# Patient Record
Sex: Female | Born: 1935 | Hispanic: Yes | Smoking: Never smoker
Health system: Southern US, Community
[De-identification: ages and names within clinical notes are randomized; demographics above are authoritative.]

## PROBLEM LIST (undated history)

## (undated) DIAGNOSIS — C801 Malignant (primary) neoplasm, unspecified: Secondary | ICD-10-CM

## (undated) DIAGNOSIS — E079 Disorder of thyroid, unspecified: Secondary | ICD-10-CM

## (undated) DIAGNOSIS — K469 Unspecified abdominal hernia without obstruction or gangrene: Secondary | ICD-10-CM

## (undated) DIAGNOSIS — N200 Calculus of kidney: Secondary | ICD-10-CM

## (undated) DIAGNOSIS — I10 Essential (primary) hypertension: Secondary | ICD-10-CM

## (undated) HISTORY — PX: TONSILLECTOMY: SUR1361

## (undated) HISTORY — DX: Disorder of thyroid, unspecified: E07.9

## (undated) HISTORY — DX: Calculus of kidney: N20.0

## (undated) HISTORY — DX: Malignant (primary) neoplasm, unspecified: C80.1

## (undated) HISTORY — DX: Essential (primary) hypertension: I10

## (undated) HISTORY — DX: Unspecified abdominal hernia without obstruction or gangrene: K46.9

---
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
## 1979-11-10 HISTORY — PX: UPPER GI ENDOSCOPY: SHX6162

## 1989-11-09 HISTORY — PX: CHOLECYSTECTOMY: SHX55

## 2005-01-08 ENCOUNTER — Ambulatory Visit: Payer: Self-pay | Admitting: Unknown Physician Specialty

## 2005-09-01 ENCOUNTER — Ambulatory Visit: Payer: Self-pay | Admitting: Unknown Physician Specialty

## 2006-07-14 ENCOUNTER — Ambulatory Visit: Payer: Self-pay | Admitting: Unknown Physician Specialty

## 2006-11-04 ENCOUNTER — Ambulatory Visit: Payer: Self-pay | Admitting: Unknown Physician Specialty

## 2006-12-02 ENCOUNTER — Other Ambulatory Visit: Payer: Self-pay

## 2006-12-02 ENCOUNTER — Emergency Department: Payer: Self-pay | Admitting: Emergency Medicine

## 2008-02-23 ENCOUNTER — Emergency Department: Payer: Self-pay | Admitting: Emergency Medicine

## 2008-02-25 ENCOUNTER — Inpatient Hospital Stay: Payer: Self-pay | Admitting: Urology

## 2008-03-08 ENCOUNTER — Ambulatory Visit: Payer: Self-pay | Admitting: Urology

## 2008-03-09 ENCOUNTER — Ambulatory Visit: Payer: Self-pay | Admitting: Urology

## 2008-03-09 ENCOUNTER — Other Ambulatory Visit: Payer: Self-pay

## 2008-03-13 ENCOUNTER — Ambulatory Visit: Payer: Self-pay | Admitting: Urology

## 2008-05-31 ENCOUNTER — Ambulatory Visit: Payer: Self-pay | Admitting: Unknown Physician Specialty

## 2008-08-13 ENCOUNTER — Ambulatory Visit: Payer: Self-pay | Admitting: Urology

## 2008-11-09 HISTORY — PX: COLONOSCOPY: SHX174

## 2008-11-09 HISTORY — PX: INSERTION CENTRAL VENOUS ACCESS DEVICE W/ SUBCUTANEOUS PORT: SUR725

## 2009-02-17 ENCOUNTER — Emergency Department: Payer: Self-pay | Admitting: Emergency Medicine

## 2009-06-03 ENCOUNTER — Ambulatory Visit: Payer: Self-pay | Admitting: Unknown Physician Specialty

## 2009-07-01 ENCOUNTER — Ambulatory Visit: Payer: Self-pay | Admitting: Urology

## 2009-07-02 ENCOUNTER — Ambulatory Visit: Payer: Self-pay | Admitting: Oncology

## 2009-07-10 ENCOUNTER — Ambulatory Visit: Payer: Self-pay | Admitting: Oncology

## 2009-07-22 ENCOUNTER — Ambulatory Visit: Payer: Self-pay | Admitting: Unknown Physician Specialty

## 2009-07-25 ENCOUNTER — Ambulatory Visit: Payer: Self-pay | Admitting: Oncology

## 2009-07-30 ENCOUNTER — Ambulatory Visit: Payer: Self-pay | Admitting: General Surgery

## 2009-08-09 ENCOUNTER — Ambulatory Visit: Payer: Self-pay | Admitting: Oncology

## 2009-09-09 ENCOUNTER — Ambulatory Visit: Payer: Self-pay | Admitting: Oncology

## 2009-10-01 ENCOUNTER — Ambulatory Visit: Payer: Self-pay | Admitting: Oncology

## 2009-10-09 ENCOUNTER — Ambulatory Visit: Payer: Self-pay | Admitting: Oncology

## 2009-11-09 ENCOUNTER — Ambulatory Visit: Payer: Self-pay | Admitting: Oncology

## 2009-12-10 ENCOUNTER — Ambulatory Visit: Payer: Self-pay | Admitting: Oncology

## 2009-12-12 ENCOUNTER — Ambulatory Visit: Payer: Self-pay | Admitting: Oncology

## 2010-01-07 ENCOUNTER — Ambulatory Visit: Payer: Self-pay | Admitting: Oncology

## 2010-01-07 ENCOUNTER — Ambulatory Visit: Payer: Self-pay | Admitting: Unknown Physician Specialty

## 2010-02-07 ENCOUNTER — Ambulatory Visit: Payer: Self-pay | Admitting: Oncology

## 2010-02-10 ENCOUNTER — Ambulatory Visit: Payer: Self-pay | Admitting: Urology

## 2010-02-11 ENCOUNTER — Ambulatory Visit: Payer: Self-pay | Admitting: Unknown Physician Specialty

## 2010-03-12 ENCOUNTER — Ambulatory Visit: Payer: Self-pay | Admitting: Unknown Physician Specialty

## 2010-05-02 ENCOUNTER — Ambulatory Visit: Payer: Self-pay | Admitting: Unknown Physician Specialty

## 2010-05-02 ENCOUNTER — Ambulatory Visit: Payer: Self-pay | Admitting: Oncology

## 2010-05-06 ENCOUNTER — Ambulatory Visit: Payer: Self-pay | Admitting: Oncology

## 2010-05-09 ENCOUNTER — Ambulatory Visit: Payer: Self-pay | Admitting: Oncology

## 2010-06-06 ENCOUNTER — Ambulatory Visit: Payer: Self-pay | Admitting: Unknown Physician Specialty

## 2010-06-09 ENCOUNTER — Ambulatory Visit: Payer: Self-pay | Admitting: Oncology

## 2010-06-24 ENCOUNTER — Ambulatory Visit: Payer: Self-pay | Admitting: Oncology

## 2010-06-24 ENCOUNTER — Ambulatory Visit: Payer: Self-pay | Admitting: Unknown Physician Specialty

## 2010-07-10 ENCOUNTER — Ambulatory Visit: Payer: Self-pay | Admitting: Oncology

## 2010-08-05 ENCOUNTER — Ambulatory Visit: Payer: Self-pay | Admitting: Unknown Physician Specialty

## 2010-08-09 ENCOUNTER — Ambulatory Visit: Payer: Self-pay | Admitting: Oncology

## 2010-08-27 ENCOUNTER — Ambulatory Visit: Payer: Self-pay | Admitting: Oncology

## 2010-09-09 ENCOUNTER — Ambulatory Visit: Payer: Self-pay | Admitting: Oncology

## 2010-09-19 IMAGING — CT CT ABD-PELV W/ CM
1 of 2 series · 15 of 32 positions shown, 19 images · non-contrast
Comparison: none

REASON FOR EXAM: lymphoma f/u
COMMENTS:

PROCEDURE:     CT  - CT ABDOMEN / PELVIS  W  - May 06, 2010 [DATE]
RESULT:
HISTORY: Lymphoma followup.
COMPARISON STUDY:    Prior CT of 07/01/2009.

[Series 2: abdomen · axial · 0.67mm/px · z∈[-1095,-715]mm · 15 of 84 slices shown, 19 images]
[im 4/84  soft-tissue]
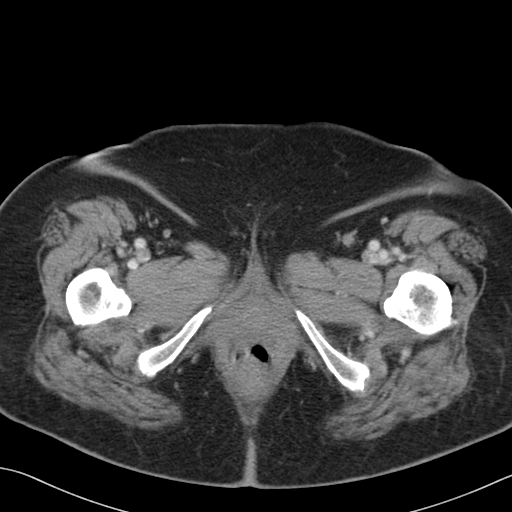
[im 4/84  bone]
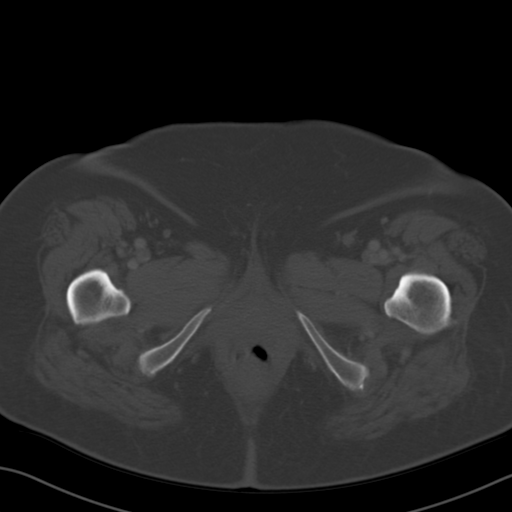
[im 11/84  soft-tissue]
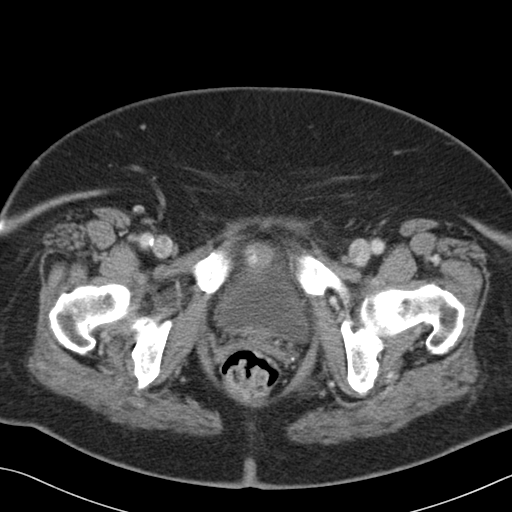
[im 18/84  soft-tissue]
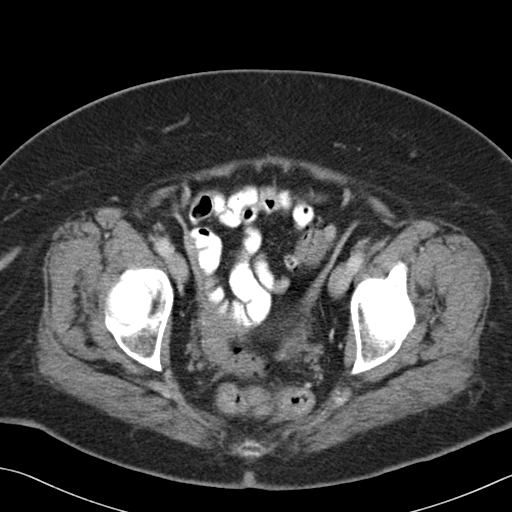
[im 25/84  soft-tissue]
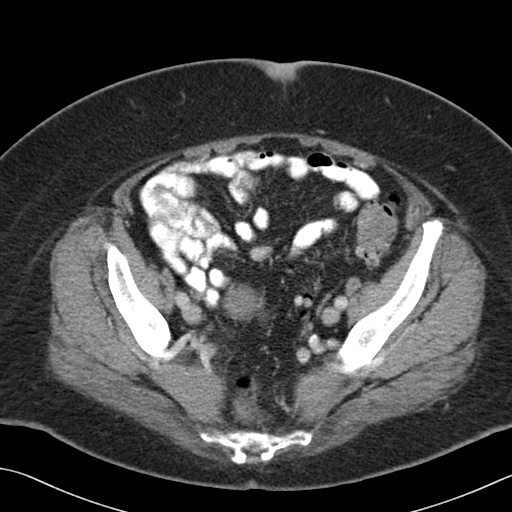
[im 28/84  soft-tissue]
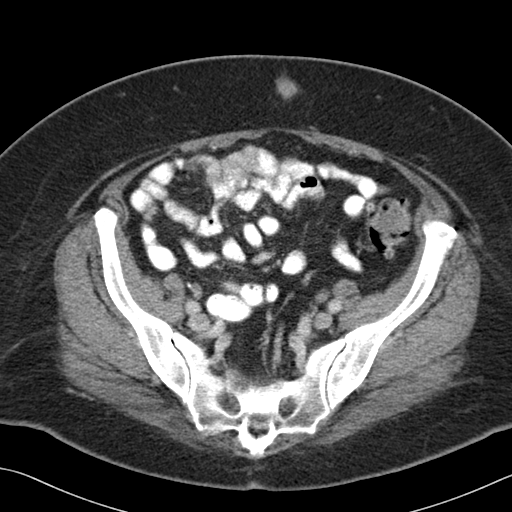
[im 35/84  soft-tissue]
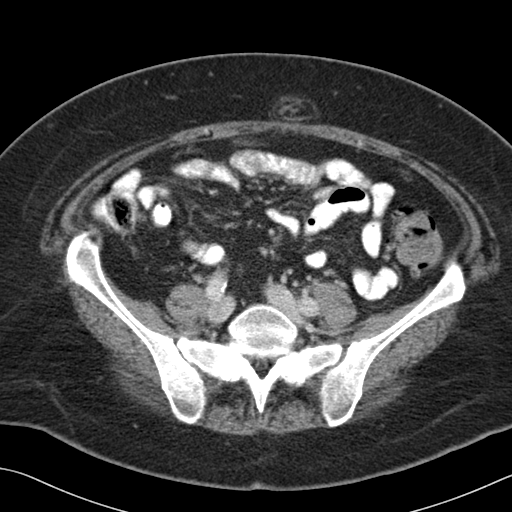
[im 42/84  soft-tissue]
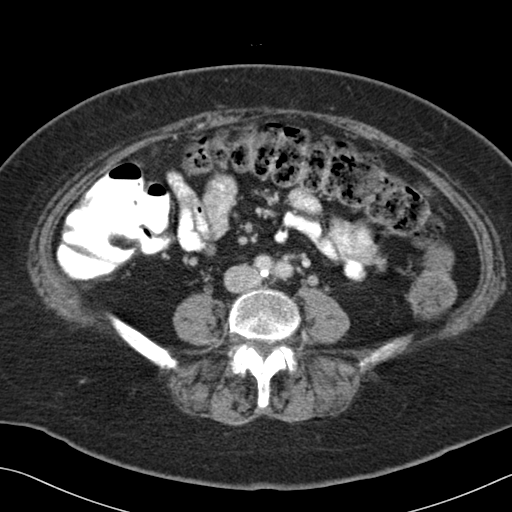
[im 49/84  soft-tissue]
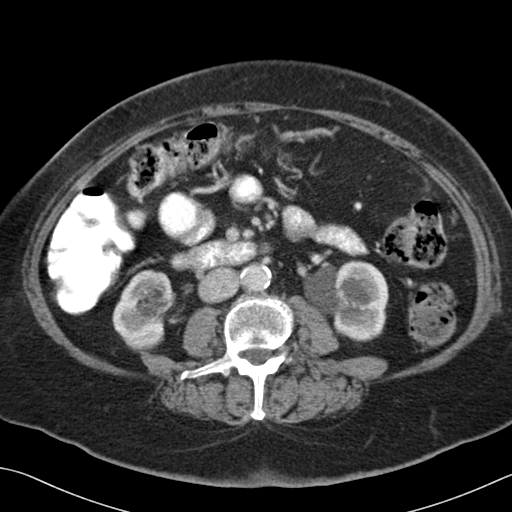
[im 56/84  soft-tissue]
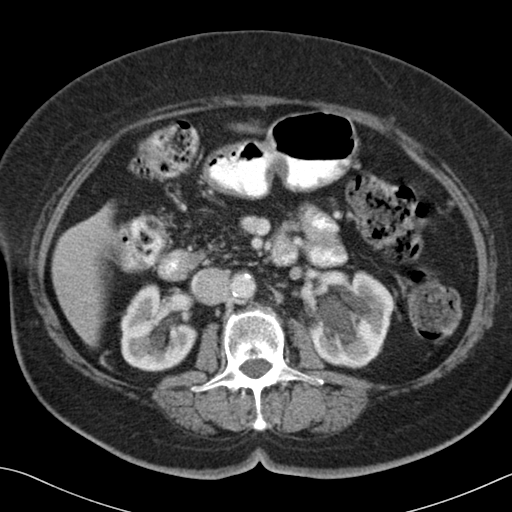
[im 56/84  bone]
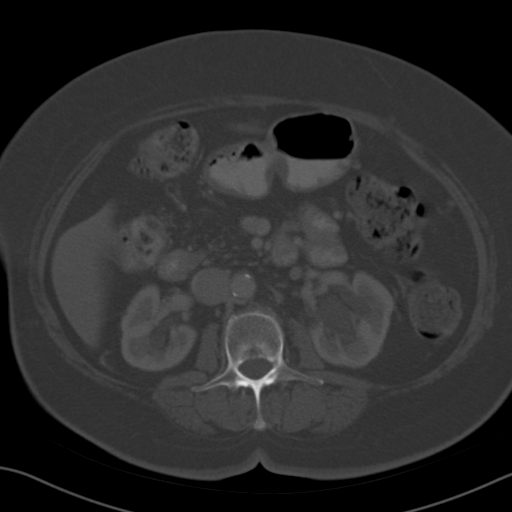
[im 59/84  soft-tissue]
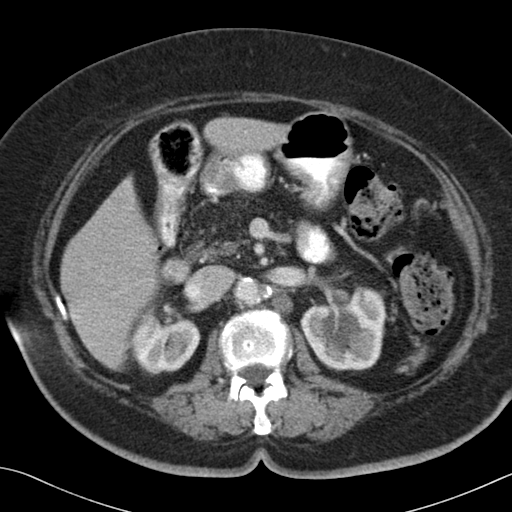
[im 66/84  soft-tissue]
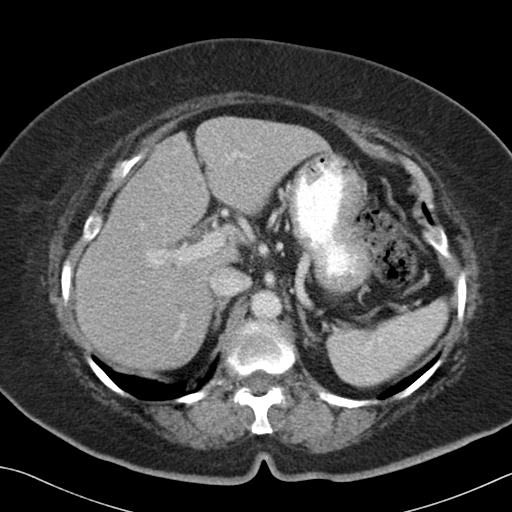
[im 70/84  lung]
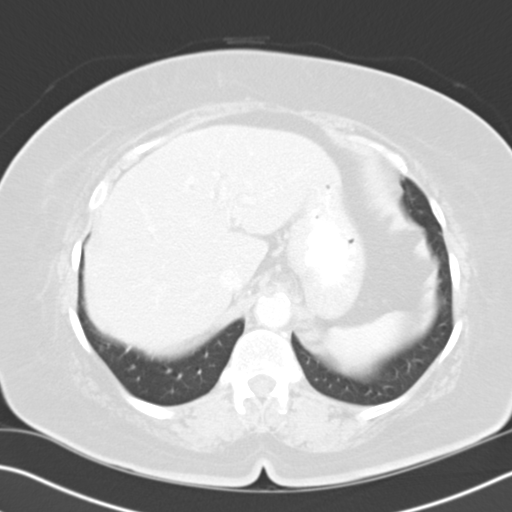
[im 73/84  soft-tissue]
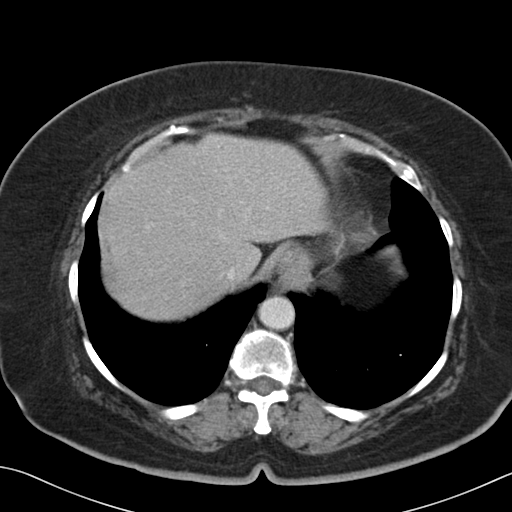
[im 73/84  lung]
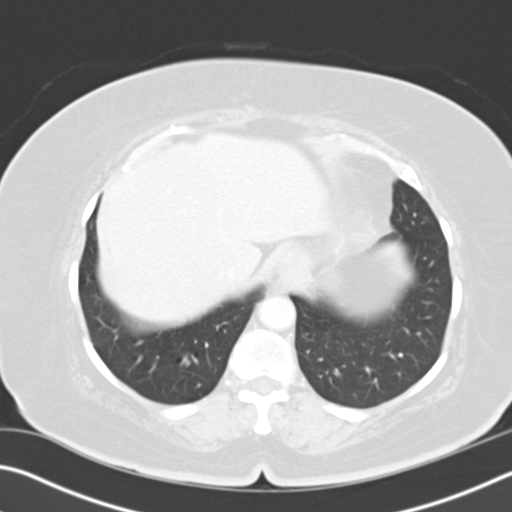
[im 77/84  lung]
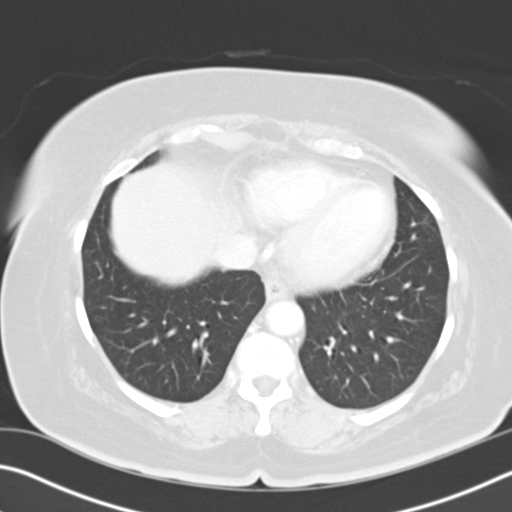
[im 80/84  soft-tissue]
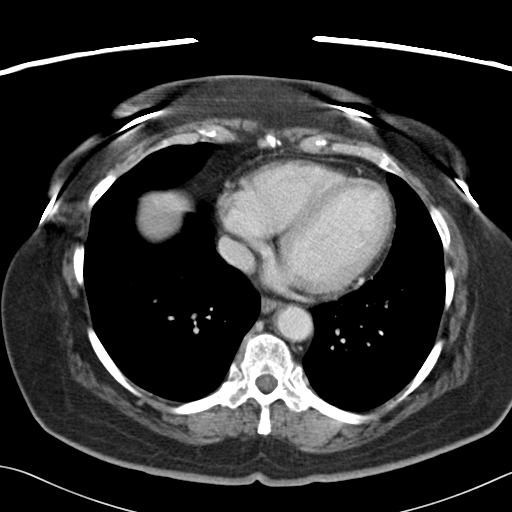
[im 80/84  lung]
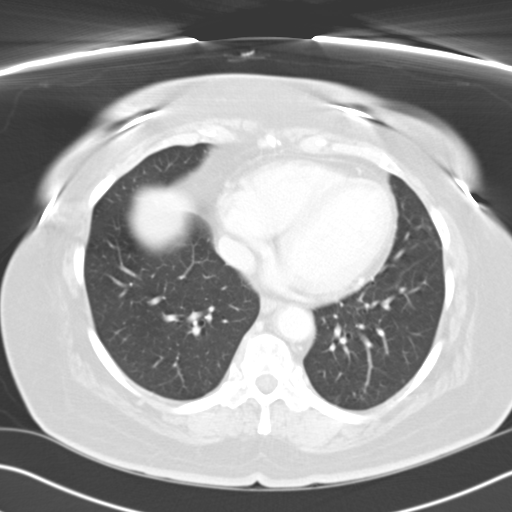

[15 of 32 positions shown; findings below may reference images not displayed]

FINDINGS: Standard IV contrast enhanced CT of the abdomen and pelvis was
obtained with 100 mL of [id number]. Comparison is made to a prior study of
07/01/2009. The liver is normal. The spleen is normal. The pancreas is fatty
replaced. Adrenals are normal. Bilateral parapelvic cysts are again noted. A
mild stable component of hydronephrosis cannot be entirely excluded. There
is no hydroureter. Previously identified mesenteric and retroperitoneal
periaortic adenopathy has improved significantly. The previously identified
large left periaortic lymph node measuring 2.9 cm on scan of 07/01/2009 now
measures only 1.4 cm. There is no bowel distention. Appendix is normal. No
free air is noted. No pathologic pelvic fluid collections are noted. Small
amount of air is present in the bladder. This could be from recent
instrumentation, please correlate with history. Similar finding was noted on
a prior study. No pathologic inguinal adenopathy is noted using CT size
criteria. Small umbilical hernia is present. There is no evidence of bowel
distention.
IMPRESSION: Marked improvement in mesenteric and retroperitoneal adenopathy.

## 2010-10-09 ENCOUNTER — Ambulatory Visit: Payer: Self-pay | Admitting: Oncology

## 2011-01-28 ENCOUNTER — Ambulatory Visit: Payer: Self-pay | Admitting: Oncology

## 2011-02-08 ENCOUNTER — Ambulatory Visit: Payer: Self-pay | Admitting: Oncology

## 2011-02-09 ENCOUNTER — Ambulatory Visit: Payer: Self-pay | Admitting: Urology

## 2011-04-29 ENCOUNTER — Ambulatory Visit: Payer: Self-pay | Admitting: Oncology

## 2011-04-30 ENCOUNTER — Ambulatory Visit: Payer: Self-pay | Admitting: Oncology

## 2011-05-10 ENCOUNTER — Ambulatory Visit: Payer: Self-pay | Admitting: Oncology

## 2011-06-29 ENCOUNTER — Encounter: Payer: Self-pay | Admitting: Unknown Physician Specialty

## 2011-07-07 ENCOUNTER — Ambulatory Visit: Payer: Self-pay | Admitting: Unknown Physician Specialty

## 2011-07-11 ENCOUNTER — Encounter: Payer: Self-pay | Admitting: Unknown Physician Specialty

## 2011-08-10 ENCOUNTER — Encounter: Payer: Self-pay | Admitting: Unknown Physician Specialty

## 2011-08-12 ENCOUNTER — Ambulatory Visit: Payer: Self-pay | Admitting: Oncology

## 2011-09-10 ENCOUNTER — Ambulatory Visit: Payer: Self-pay | Admitting: Oncology

## 2011-12-14 ENCOUNTER — Ambulatory Visit: Payer: Self-pay | Admitting: Oncology

## 2011-12-14 LAB — CREATININE, SERUM
Creatinine: 1.35 mg/dL — ABNORMAL HIGH (ref 0.60–1.30)
EGFR (African American): 49 — ABNORMAL LOW
EGFR (Non-African Amer.): 40 — ABNORMAL LOW

## 2011-12-18 LAB — CBC CANCER CENTER
Basophil #: 0.1 x10 3/mm (ref 0.0–0.1)
Basophil %: 1.3 %
Eosinophil #: 0.3 x10 3/mm (ref 0.0–0.7)
HCT: 37.1 % (ref 35.0–47.0)
HGB: 12.1 g/dL (ref 12.0–16.0)
Lymphocyte #: 1.6 x10 3/mm (ref 1.0–3.6)
Lymphocyte %: 34.6 %
MCH: 31.8 pg (ref 26.0–34.0)
MCHC: 32.5 g/dL (ref 32.0–36.0)
MCV: 97.7 fL (ref 80–100)
Monocyte #: 0.3 x10 3/mm (ref 0.0–0.7)
Monocyte %: 6.1 %
Neutrophil #: 2.5 x10 3/mm (ref 1.4–6.5)
RBC: 3.8 10*6/uL (ref 3.80–5.20)
RDW: 13.5 % (ref 11.5–14.5)
WBC: 4.8 x10 3/mm (ref 3.6–11.0)

## 2011-12-18 LAB — COMPREHENSIVE METABOLIC PANEL
Albumin: 3.8 g/dL (ref 3.4–5.0)
Alkaline Phosphatase: 90 U/L (ref 50–136)
Anion Gap: 7 (ref 7–16)
Bilirubin,Total: 0.4 mg/dL (ref 0.2–1.0)
Co2: 29 mmol/L (ref 21–32)
Creatinine: 1.28 mg/dL (ref 0.60–1.30)
Glucose: 150 mg/dL — ABNORMAL HIGH (ref 65–99)
Osmolality: 288 (ref 275–301)
Potassium: 4 mmol/L (ref 3.5–5.1)
Sodium: 141 mmol/L (ref 136–145)
Total Protein: 6.8 g/dL (ref 6.4–8.2)

## 2012-01-03 ENCOUNTER — Emergency Department: Payer: Self-pay | Admitting: Emergency Medicine

## 2012-01-03 LAB — URINALYSIS, COMPLETE
Bilirubin,UR: NEGATIVE
Blood: NEGATIVE
Ketone: NEGATIVE
Nitrite: NEGATIVE
Ph: 6 (ref 4.5–8.0)
Protein: NEGATIVE
Squamous Epithelial: <1

## 2012-01-03 LAB — PRO B NATRIURETIC PEPTIDE: B-Type Natriuretic Peptide: 270 pg/mL (ref 0–450)

## 2012-01-03 LAB — TROPONIN I: Troponin-I: <0.02 ng/mL

## 2012-01-03 LAB — CK TOTAL AND CKMB (NOT AT ARMC): CK-MB: <0.5 ng/mL — ABNORMAL LOW (ref 0.5–3.6)

## 2012-01-03 LAB — COMPREHENSIVE METABOLIC PANEL
Bilirubin,Total: 0.4 mg/dL (ref 0.2–1.0)
Calcium, Total: 9 mg/dL (ref 8.5–10.1)
Co2: 25 mmol/L (ref 21–32)
EGFR (African American): 52 — ABNORMAL LOW
EGFR (Non-African Amer.): 43 — ABNORMAL LOW
Glucose: 161 mg/dL — ABNORMAL HIGH (ref 65–99)
SGOT(AST): 46 U/L — ABNORMAL HIGH (ref 15–37)
SGPT (ALT): 45 U/L
Total Protein: 7 g/dL (ref 6.4–8.2)

## 2012-01-03 LAB — CBC
HCT: 39.1 % (ref 35.0–47.0)
MCV: 97 fL (ref 80–100)
Platelet: 207 10*3/uL (ref 150–440)
RBC: 4.04 10*6/uL (ref 3.80–5.20)
WBC: 5.3 10*3/uL (ref 3.6–11.0)

## 2012-01-04 LAB — URINE CULTURE

## 2012-01-08 ENCOUNTER — Ambulatory Visit: Payer: Self-pay | Admitting: Oncology

## 2012-06-15 ENCOUNTER — Ambulatory Visit: Payer: Self-pay | Admitting: Orthopedic Surgery

## 2012-06-23 ENCOUNTER — Ambulatory Visit: Payer: Self-pay | Admitting: Orthopedic Surgery

## 2012-06-23 LAB — BASIC METABOLIC PANEL
BUN: 31 mg/dL — ABNORMAL HIGH (ref 7–18)
Calcium, Total: 9.2 mg/dL (ref 8.5–10.1)
Chloride: 105 mmol/L (ref 98–107)
Co2: 30 mmol/L (ref 21–32)
EGFR (Non-African Amer.): 49 — ABNORMAL LOW
Glucose: 105 mg/dL — ABNORMAL HIGH (ref 65–99)
Osmolality: 288 (ref 275–301)
Potassium: 4.2 mmol/L (ref 3.5–5.1)
Sodium: 141 mmol/L (ref 136–145)

## 2012-06-30 ENCOUNTER — Ambulatory Visit: Payer: Self-pay | Admitting: Orthopedic Surgery

## 2012-07-08 ENCOUNTER — Ambulatory Visit: Payer: Self-pay | Admitting: Oncology

## 2012-07-08 LAB — LACTATE DEHYDROGENASE: LDH: 162 U/L (ref 81–234)

## 2012-07-08 LAB — CBC CANCER CENTER
Basophil #: 0.1 x10 3/mm (ref 0.0–0.1)
Eosinophil #: 0.2 x10 3/mm (ref 0.0–0.7)
Eosinophil %: 3.4 %
Lymphocyte #: 1.9 x10 3/mm (ref 1.0–3.6)
Lymphocyte %: 30.3 %
MCHC: 33.5 g/dL (ref 32.0–36.0)
MCV: 97 fL (ref 80–100)
Monocyte %: 7.7 %
Neutrophil #: 3.7 x10 3/mm (ref 1.4–6.5)
Neutrophil %: 57.3 %
Platelet: 201 x10 3/mm (ref 150–440)
RBC: 3.8 10*6/uL (ref 3.80–5.20)
RDW: 14 % (ref 11.5–14.5)
WBC: 6.4 x10 3/mm (ref 3.6–11.0)

## 2012-07-08 LAB — COMPREHENSIVE METABOLIC PANEL
Albumin: 3.6 g/dL (ref 3.4–5.0)
Anion Gap: 7 (ref 7–16)
BUN: 29 mg/dL — ABNORMAL HIGH (ref 7–18)
Calcium, Total: 9.4 mg/dL (ref 8.5–10.1)
Chloride: 105 mmol/L (ref 98–107)
Co2: 28 mmol/L (ref 21–32)
EGFR (African American): 52 — ABNORMAL LOW
Osmolality: 287 (ref 275–301)
Potassium: 4.1 mmol/L (ref 3.5–5.1)
SGPT (ALT): 86 U/L — ABNORMAL HIGH (ref 12–78)
Total Protein: 6.7 g/dL (ref 6.4–8.2)

## 2012-07-08 LAB — SEDIMENTATION RATE: Erythrocyte Sed Rate: 14 mm/hr (ref 0–30)

## 2012-07-10 ENCOUNTER — Ambulatory Visit: Payer: Self-pay | Admitting: Oncology

## 2012-07-12 ENCOUNTER — Ambulatory Visit: Payer: Self-pay | Admitting: Unknown Physician Specialty

## 2012-12-23 DIAGNOSIS — M5430 Sciatica, unspecified side: Secondary | ICD-10-CM | POA: Insufficient documentation

## 2012-12-23 DIAGNOSIS — N200 Calculus of kidney: Secondary | ICD-10-CM | POA: Insufficient documentation

## 2012-12-23 DIAGNOSIS — C8333 Diffuse large B-cell lymphoma, intra-abdominal lymph nodes: Secondary | ICD-10-CM | POA: Insufficient documentation

## 2012-12-23 DIAGNOSIS — N3941 Urge incontinence: Secondary | ICD-10-CM | POA: Insufficient documentation

## 2012-12-23 DIAGNOSIS — R1031 Right lower quadrant pain: Secondary | ICD-10-CM | POA: Insufficient documentation

## 2012-12-29 ENCOUNTER — Ambulatory Visit: Payer: Self-pay | Admitting: Urology

## 2013-01-04 ENCOUNTER — Ambulatory Visit: Payer: Self-pay | Admitting: Oncology

## 2013-01-16 ENCOUNTER — Ambulatory Visit: Payer: Self-pay | Admitting: Oncology

## 2013-02-02 DIAGNOSIS — N3000 Acute cystitis without hematuria: Secondary | ICD-10-CM | POA: Insufficient documentation

## 2013-02-06 ENCOUNTER — Ambulatory Visit: Payer: Self-pay | Admitting: Cardiology

## 2013-02-06 DIAGNOSIS — R931 Abnormal findings on diagnostic imaging of heart and coronary circulation: Secondary | ICD-10-CM | POA: Insufficient documentation

## 2013-02-07 ENCOUNTER — Ambulatory Visit: Payer: Self-pay | Admitting: Oncology

## 2013-03-03 LAB — COMPREHENSIVE METABOLIC PANEL
Albumin: 3.4 g/dL (ref 3.4–5.0)
Anion Gap: 7 (ref 7–16)
BUN: 24 mg/dL — ABNORMAL HIGH (ref 7–18)
Chloride: 106 mmol/L (ref 98–107)
Co2: 30 mmol/L (ref 21–32)
Creatinine: 1.2 mg/dL (ref 0.60–1.30)
EGFR (African American): 50 — ABNORMAL LOW
Potassium: 3.8 mmol/L (ref 3.5–5.1)
SGOT(AST): 44 U/L — ABNORMAL HIGH (ref 15–37)
SGPT (ALT): 48 U/L (ref 12–78)
Sodium: 143 mmol/L (ref 136–145)
Total Protein: 6.5 g/dL (ref 6.4–8.2)

## 2013-03-03 LAB — CBC CANCER CENTER
Basophil #: 0.1 x10 3/mm (ref 0.0–0.1)
Basophil %: 2.7 %
Eosinophil #: 0.4 x10 3/mm (ref 0.0–0.7)
HGB: 12 g/dL (ref 12.0–16.0)
Lymphocyte #: 1.9 x10 3/mm (ref 1.0–3.6)
Lymphocyte %: 35.6 %
MCHC: 33.9 g/dL (ref 32.0–36.0)
MCV: 94 fL (ref 80–100)
Monocyte #: 0.3 x10 3/mm (ref 0.2–0.9)
Monocyte %: 6.2 %
Neutrophil #: 2.6 x10 3/mm (ref 1.4–6.5)
Neutrophil %: 48.6 %
Platelet: 211 x10 3/mm (ref 150–440)
RDW: 14.1 % (ref 11.5–14.5)
WBC: 5.3 x10 3/mm (ref 3.6–11.0)

## 2013-03-03 LAB — SEDIMENTATION RATE: Erythrocyte Sed Rate: 21 mm/hr (ref 0–30)

## 2013-03-03 LAB — LACTATE DEHYDROGENASE: LDH: 202 U/L (ref 81–246)

## 2013-03-09 ENCOUNTER — Ambulatory Visit: Payer: Self-pay | Admitting: Oncology

## 2013-05-12 ENCOUNTER — Emergency Department: Payer: Self-pay | Admitting: Emergency Medicine

## 2013-07-07 ENCOUNTER — Ambulatory Visit: Payer: Self-pay | Admitting: Family Medicine

## 2013-07-17 ENCOUNTER — Ambulatory Visit: Payer: Self-pay | Admitting: Physician Assistant

## 2013-08-01 ENCOUNTER — Ambulatory Visit: Payer: Self-pay | Admitting: Internal Medicine

## 2013-08-09 ENCOUNTER — Ambulatory Visit: Payer: Self-pay | Admitting: Oncology

## 2013-10-13 ENCOUNTER — Ambulatory Visit: Payer: Self-pay | Admitting: Physician Assistant

## 2013-10-24 ENCOUNTER — Encounter: Payer: Self-pay | Admitting: General Surgery

## 2013-10-24 ENCOUNTER — Ambulatory Visit (INDEPENDENT_AMBULATORY_CARE_PROVIDER_SITE_OTHER): Payer: Medicare Other | Admitting: General Surgery

## 2013-10-24 VITALS — BP 130/76 | Ht 61.0 in | Wt 193.0 lb

## 2013-10-24 DIAGNOSIS — Z8572 Personal history of non-Hodgkin lymphomas: Secondary | ICD-10-CM | POA: Insufficient documentation

## 2013-10-24 DIAGNOSIS — Z87898 Personal history of other specified conditions: Secondary | ICD-10-CM

## 2013-10-24 NOTE — Progress Notes (Signed)
She is here today for port removal. Patient had Non Hodgkin's Lymphoma and is now in remission.  Her last chemotherapy was January  2011.   Port removed today.  Anesthetic: 1% xylocaine mixed with 0.5%marcaine 10ml  After prep with chloraprep, there prior incision was opened. Capsule around port opened, anchoring sutures removed and port /catheter removed in entirety.  No bleeding noted. Wound closed with 3-0 vicryl in subcutaneous tissue and skin closed with 4-0 vicry. Dressed with steri strips, telfa and tegaderm.  Procedure well tolerated.

## 2013-10-24 NOTE — Progress Notes (Deleted)
Patient presents for a port removal at today's visit.

## 2013-10-24 NOTE — Patient Instructions (Addendum)
Keep area clean and dry Remove dressing in 2-3 days Leave steri strip in place for one week.

## 2013-11-23 ENCOUNTER — Emergency Department: Payer: Self-pay | Admitting: Emergency Medicine

## 2013-11-25 LAB — BETA STREP CULTURE(ARMC)

## 2014-01-24 ENCOUNTER — Ambulatory Visit: Payer: Self-pay | Admitting: Oncology

## 2014-01-26 LAB — CBC CANCER CENTER
Basophil #: 0.1 x10 3/mm (ref 0.0–0.1)
Basophil %: 2.3 %
Eosinophil #: 0.3 x10 3/mm (ref 0.0–0.7)
Eosinophil %: 4.3 %
HCT: 39.3 % (ref 35.0–47.0)
HGB: 13 g/dL (ref 12.0–16.0)
LYMPHS PCT: 36.2 %
Lymphocyte #: 2.2 x10 3/mm (ref 1.0–3.6)
MCH: 31.5 pg (ref 26.0–34.0)
MCHC: 33 g/dL (ref 32.0–36.0)
MCV: 95 fL (ref 80–100)
MONO ABS: 0.4 x10 3/mm (ref 0.2–0.9)
Monocyte %: 6.4 %
Neutrophil #: 3.1 x10 3/mm (ref 1.4–6.5)
Neutrophil %: 50.8 %
Platelet: 217 x10 3/mm (ref 150–440)
RBC: 4.12 10*6/uL (ref 3.80–5.20)
RDW: 13.8 % (ref 11.5–14.5)
WBC: 6.2 x10 3/mm (ref 3.6–11.0)

## 2014-01-26 LAB — COMPREHENSIVE METABOLIC PANEL
ANION GAP: 6 — AB (ref 7–16)
Albumin: 3.6 g/dL (ref 3.4–5.0)
Alkaline Phosphatase: 81 U/L
BILIRUBIN TOTAL: 0.3 mg/dL (ref 0.2–1.0)
BUN: 27 mg/dL — ABNORMAL HIGH (ref 7–18)
CO2: 30 mmol/L (ref 21–32)
Calcium, Total: 8.9 mg/dL (ref 8.5–10.1)
Chloride: 106 mmol/L (ref 98–107)
Creatinine: 1.23 mg/dL (ref 0.60–1.30)
GFR CALC AF AMER: 48 — AB
GFR CALC NON AF AMER: 42 — AB
GLUCOSE: 105 mg/dL — AB (ref 65–99)
Osmolality: 289 (ref 275–301)
Potassium: 4.2 mmol/L (ref 3.5–5.1)
SGOT(AST): 37 U/L (ref 15–37)
SGPT (ALT): 46 U/L (ref 12–78)
Sodium: 142 mmol/L (ref 136–145)
Total Protein: 6.8 g/dL (ref 6.4–8.2)

## 2014-01-26 LAB — LACTATE DEHYDROGENASE: LDH: 154 U/L (ref 81–246)

## 2014-01-26 LAB — SEDIMENTATION RATE: Erythrocyte Sed Rate: 13 mm/hr (ref 0–30)

## 2014-02-07 ENCOUNTER — Ambulatory Visit: Payer: Self-pay | Admitting: Oncology

## 2014-05-19 ENCOUNTER — Emergency Department: Payer: Self-pay | Admitting: Emergency Medicine

## 2014-05-23 DIAGNOSIS — I10 Essential (primary) hypertension: Secondary | ICD-10-CM | POA: Insufficient documentation

## 2014-07-18 ENCOUNTER — Ambulatory Visit: Payer: Self-pay | Admitting: Physician Assistant

## 2014-07-23 ENCOUNTER — Ambulatory Visit: Payer: Self-pay | Admitting: Physician Assistant

## 2014-07-27 ENCOUNTER — Ambulatory Visit: Payer: Self-pay | Admitting: Oncology

## 2014-07-27 LAB — COMPREHENSIVE METABOLIC PANEL
ALK PHOS: 78 U/L
Albumin: 3.6 g/dL (ref 3.4–5.0)
Anion Gap: 7 (ref 7–16)
BUN: 22 mg/dL — ABNORMAL HIGH (ref 7–18)
Bilirubin,Total: 0.6 mg/dL (ref 0.2–1.0)
CALCIUM: 9 mg/dL (ref 8.5–10.1)
CO2: 30 mmol/L (ref 21–32)
Chloride: 104 mmol/L (ref 98–107)
Creatinine: 1.4 mg/dL — ABNORMAL HIGH (ref 0.60–1.30)
EGFR (Non-African Amer.): 36 — ABNORMAL LOW
GFR CALC AF AMER: 41 — AB
GLUCOSE: 136 mg/dL — AB (ref 65–99)
Osmolality: 287 (ref 275–301)
POTASSIUM: 3.7 mmol/L (ref 3.5–5.1)
SGOT(AST): 35 U/L (ref 15–37)
SGPT (ALT): 30 U/L
Sodium: 141 mmol/L (ref 136–145)
Total Protein: 6.9 g/dL (ref 6.4–8.2)

## 2014-07-27 LAB — CBC CANCER CENTER
BASOS PCT: 2.5 %
Basophil #: 0.2 x10 3/mm — ABNORMAL HIGH (ref 0.0–0.1)
EOS PCT: 3.9 %
Eosinophil #: 0.2 x10 3/mm (ref 0.0–0.7)
HCT: 39.8 % (ref 35.0–47.0)
HGB: 13.2 g/dL (ref 12.0–16.0)
LYMPHS PCT: 37 %
Lymphocyte #: 2.2 x10 3/mm (ref 1.0–3.6)
MCH: 31.8 pg (ref 26.0–34.0)
MCHC: 33.2 g/dL (ref 32.0–36.0)
MCV: 96 fL (ref 80–100)
MONOS PCT: 7.5 %
Monocyte #: 0.5 x10 3/mm (ref 0.2–0.9)
Neutrophil #: 3 x10 3/mm (ref 1.4–6.5)
Neutrophil %: 49.1 %
PLATELETS: 220 x10 3/mm (ref 150–440)
RBC: 4.15 10*6/uL (ref 3.80–5.20)
RDW: 14.3 % (ref 11.5–14.5)
WBC: 6.1 x10 3/mm (ref 3.6–11.0)

## 2014-07-27 LAB — LACTATE DEHYDROGENASE: LDH: 172 U/L (ref 81–246)

## 2014-08-06 ENCOUNTER — Ambulatory Visit: Payer: Self-pay | Admitting: Unknown Physician Specialty

## 2014-08-08 LAB — PATHOLOGY REPORT

## 2014-08-09 ENCOUNTER — Ambulatory Visit: Payer: Self-pay | Admitting: Oncology

## 2014-08-23 DIAGNOSIS — I2510 Atherosclerotic heart disease of native coronary artery without angina pectoris: Secondary | ICD-10-CM | POA: Insufficient documentation

## 2014-11-07 DIAGNOSIS — J069 Acute upper respiratory infection, unspecified: Secondary | ICD-10-CM | POA: Insufficient documentation

## 2015-01-21 ENCOUNTER — Ambulatory Visit
Admit: 2015-01-21 | Disposition: A | Discharge status: Home / Self Care | Payer: Self-pay | Attending: Oncology | Admitting: Oncology

## 2015-02-08 ENCOUNTER — Ambulatory Visit
Admit: 2015-02-08 | Disposition: A | Discharge status: Home / Self Care | Payer: Self-pay | Attending: Oncology | Admitting: Oncology

## 2015-02-26 NOTE — Op Note (Signed)
PATIENT NAME:  Rebecca, Stokes MR#:  [id number] DATE OF BIRTH:  06-08-1932  DATE OF PROCEDURE:  06/30/2012  PREOPERATIVE DIAGNOSIS: Right gamekeeper thumb.   POSTOPERATIVE DIAGNOSES: Right gamekeeper thumb.   PROCEDURE:   Repair of gamekeeper thumb ulnar collateral ligament MCP joint.   SURGEON: Laurene Footman, MD.   ANESTHESIA: General.   DESCRIPTION OF PROCEDURE: The patient was brought to the Operating Room and after adequate anesthesia was obtained, the right arm was prepped and draped in the usual sterile fashion with a tourniquet applied to the upper arm. After appropriate patient identification and time-out procedures were carried out, an incision was made at the dorsal ulnar aspect of the MCP joint approximately 3 cm in length. The subcutaneous tissue was spread preserving the dorsal nerve. The adductor aponeurosis was identified and incised. Underneath this the ulnar collateral ligament was found to be completely ruptured. It had come off close to its attachment on the metacarpal head. The ends of the ligament were mobilized and then repaired using a 3-0 Ethibond suture. After repairing the ligament, the thumb was stable in both flexion and extension at the MCP joint to ulnar deviation. The adductor tendon was then repaired using a 3-0 Vicryl. The wound was irrigated and closed with 4-0 nylon. 5 mL of 0.5% Sensorcaine without epinephrine were infiltrated for postoperative analgesia. Xeroform, 4 x 4's, Webril and a radial gutter splint were applied. The tourniquet was let down at the close of the case. Tourniquet time was 20 minutes at 250 mmHg. There were no specimens and no complications.   ____________________________ Laurene Footman, MD mjm:ap D: 06/30/2012 20:57:50 ET T: 07/01/2012 08:54:39 ET JOB#: [id number]  cc: Laurene Footman, MD, <Dictator>  Laurene Footman MD ELECTRONICALLY SIGNED 07/01/2012 9:59

## 2015-06-11 ENCOUNTER — Emergency Department
Admission: EM | Admit: 2015-06-11 | Discharge: 2015-06-11 | Discharge status: Against Medical Advice | Payer: Medicare Other | Attending: Emergency Medicine | Admitting: Emergency Medicine

## 2015-06-11 DIAGNOSIS — Z87891 Personal history of nicotine dependence: Secondary | ICD-10-CM | POA: Diagnosis not present

## 2015-06-11 DIAGNOSIS — I10 Essential (primary) hypertension: Secondary | ICD-10-CM | POA: Diagnosis not present

## 2015-06-11 DIAGNOSIS — K922 Gastrointestinal hemorrhage, unspecified: Secondary | ICD-10-CM | POA: Diagnosis present

## 2015-06-11 LAB — CBC
HEMATOCRIT: 39.9 % (ref 35.0–47.0)
Hemoglobin: 13.3 g/dL (ref 12.0–16.0)
MCH: 31 pg (ref 26.0–34.0)
MCHC: 33.3 g/dL (ref 32.0–36.0)
MCV: 93.2 fL (ref 80.0–100.0)
PLATELETS: 205 10*3/uL (ref 150–440)
RBC: 4.28 MIL/uL (ref 3.80–5.20)
RDW: 14.2 % (ref 11.5–14.5)
WBC: 6.1 10*3/uL (ref 3.6–11.0)

## 2015-06-11 LAB — COMPREHENSIVE METABOLIC PANEL
ALT: 16 U/L (ref 14–54)
ANION GAP: 7 (ref 5–15)
AST: 32 U/L (ref 15–41)
Albumin: 4.1 g/dL (ref 3.5–5.0)
Alkaline Phosphatase: 60 U/L (ref 38–126)
BUN: 24 mg/dL — ABNORMAL HIGH (ref 6–20)
CO2: 28 mmol/L (ref 22–32)
Calcium: 9.3 mg/dL (ref 8.9–10.3)
Chloride: 106 mmol/L (ref 101–111)
Creatinine, Ser: 1.09 mg/dL — ABNORMAL HIGH (ref 0.44–1.00)
GFR calc Af Amer: 54 mL/min — ABNORMAL LOW (ref >60)
GFR calc non Af Amer: 47 mL/min — ABNORMAL LOW (ref >60)
Glucose, Bld: 112 mg/dL — ABNORMAL HIGH (ref 65–99)
POTASSIUM: 4.3 mmol/L (ref 3.5–5.1)
SODIUM: 141 mmol/L (ref 135–145)
Total Bilirubin: 0.7 mg/dL (ref 0.3–1.2)
Total Protein: 7.1 g/dL (ref 6.5–8.1)

## 2015-06-11 LAB — TYPE AND SCREEN
ABO/RH(D): O POS
Antibody Screen: NEGATIVE

## 2015-06-11 LAB — ABO/RH: ABO/RH(D): O POS

## 2015-06-11 NOTE — ED Notes (Signed)
Pt states she is going to make an appointment tomorrow with her PCP because she doesn't want to wait to be seen.

## 2015-06-11 NOTE — ED Notes (Signed)
Pt c/o passing a large hard stool yesterday and passed bright red blood with it, states today has bloody diarrhea x1.Marland Kitchen

## 2015-07-19 ENCOUNTER — Other Ambulatory Visit: Payer: Self-pay | Admitting: *Deleted

## 2015-07-19 DIAGNOSIS — Z8572 Personal history of non-Hodgkin lymphomas: Secondary | ICD-10-CM

## 2015-07-25 ENCOUNTER — Other Ambulatory Visit: Payer: Self-pay

## 2015-07-25 ENCOUNTER — Ambulatory Visit: Payer: Self-pay | Admitting: Oncology

## 2015-07-31 ENCOUNTER — Ambulatory Visit: Payer: Medicare Other

## 2015-07-31 ENCOUNTER — Inpatient Hospital Stay: Payer: Medicare Other | Attending: Oncology | Admitting: Oncology

## 2015-07-31 ENCOUNTER — Inpatient Hospital Stay: Payer: Medicare Other

## 2015-07-31 ENCOUNTER — Encounter: Payer: Self-pay | Admitting: Oncology

## 2015-07-31 VITALS — BP 151/68 | HR 66 | Temp 94.8°F | Wt 175.2 lb

## 2015-07-31 DIAGNOSIS — E079 Disorder of thyroid, unspecified: Secondary | ICD-10-CM | POA: Diagnosis not present

## 2015-07-31 DIAGNOSIS — Z23 Encounter for immunization: Secondary | ICD-10-CM | POA: Insufficient documentation

## 2015-07-31 DIAGNOSIS — Z7982 Long term (current) use of aspirin: Secondary | ICD-10-CM | POA: Insufficient documentation

## 2015-07-31 DIAGNOSIS — K635 Polyp of colon: Secondary | ICD-10-CM | POA: Insufficient documentation

## 2015-07-31 DIAGNOSIS — E7800 Pure hypercholesterolemia, unspecified: Secondary | ICD-10-CM | POA: Insufficient documentation

## 2015-07-31 DIAGNOSIS — Z8572 Personal history of non-Hodgkin lymphomas: Secondary | ICD-10-CM

## 2015-07-31 DIAGNOSIS — Z79899 Other long term (current) drug therapy: Secondary | ICD-10-CM | POA: Diagnosis not present

## 2015-07-31 DIAGNOSIS — Z87891 Personal history of nicotine dependence: Secondary | ICD-10-CM | POA: Diagnosis not present

## 2015-07-31 DIAGNOSIS — R51 Headache: Secondary | ICD-10-CM

## 2015-07-31 DIAGNOSIS — R519 Headache, unspecified: Secondary | ICD-10-CM | POA: Insufficient documentation

## 2015-07-31 DIAGNOSIS — I10 Essential (primary) hypertension: Secondary | ICD-10-CM | POA: Diagnosis not present

## 2015-07-31 DIAGNOSIS — F419 Anxiety disorder, unspecified: Secondary | ICD-10-CM | POA: Insufficient documentation

## 2015-07-31 DIAGNOSIS — Z87442 Personal history of urinary calculi: Secondary | ICD-10-CM | POA: Diagnosis not present

## 2015-07-31 DIAGNOSIS — Z9221 Personal history of antineoplastic chemotherapy: Secondary | ICD-10-CM

## 2015-07-31 DIAGNOSIS — IMO0002 Reserved for concepts with insufficient information to code with codable children: Secondary | ICD-10-CM | POA: Insufficient documentation

## 2015-07-31 DIAGNOSIS — Z87898 Personal history of other specified conditions: Secondary | ICD-10-CM | POA: Insufficient documentation

## 2015-07-31 DIAGNOSIS — E039 Hypothyroidism, unspecified: Secondary | ICD-10-CM | POA: Diagnosis not present

## 2015-07-31 DIAGNOSIS — C8330 Diffuse large B-cell lymphoma, unspecified site: Secondary | ICD-10-CM | POA: Insufficient documentation

## 2015-07-31 DIAGNOSIS — K5790 Diverticulosis of intestine, part unspecified, without perforation or abscess without bleeding: Secondary | ICD-10-CM | POA: Insufficient documentation

## 2015-07-31 DIAGNOSIS — C8590 Non-Hodgkin lymphoma, unspecified, unspecified site: Secondary | ICD-10-CM

## 2015-07-31 DIAGNOSIS — K5732 Diverticulitis of large intestine without perforation or abscess without bleeding: Secondary | ICD-10-CM | POA: Insufficient documentation

## 2015-07-31 LAB — COMPREHENSIVE METABOLIC PANEL
ALBUMIN: 4.2 g/dL (ref 3.5–5.0)
ALT: 17 U/L (ref 14–54)
AST: 29 U/L (ref 15–41)
Alkaline Phosphatase: 59 U/L (ref 38–126)
Anion gap: 8 (ref 5–15)
BUN: 34 mg/dL — AB (ref 6–20)
CO2: 27 mmol/L (ref 22–32)
Calcium: 9.6 mg/dL (ref 8.9–10.3)
Chloride: 104 mmol/L (ref 101–111)
Creatinine, Ser: 1.2 mg/dL — ABNORMAL HIGH (ref 0.44–1.00)
GFR calc Af Amer: 48 mL/min — ABNORMAL LOW (ref >60)
GFR calc non Af Amer: 42 mL/min — ABNORMAL LOW (ref >60)
Glucose, Bld: 101 mg/dL — ABNORMAL HIGH (ref 65–99)
POTASSIUM: 4.7 mmol/L (ref 3.5–5.1)
Sodium: 139 mmol/L (ref 135–145)
Total Bilirubin: 0.9 mg/dL (ref 0.3–1.2)
Total Protein: 7 g/dL (ref 6.5–8.1)

## 2015-07-31 LAB — LACTATE DEHYDROGENASE: LDH: 131 U/L (ref 98–192)

## 2015-07-31 LAB — CBC WITH DIFFERENTIAL/PLATELET
BASOS ABS: 0.1 10*3/uL (ref 0–0.1)
BASOS PCT: 1 %
Eosinophils Absolute: 0.1 10*3/uL (ref 0–0.7)
Eosinophils Relative: 1 %
HCT: 39.7 % (ref 35.0–47.0)
Hemoglobin: 13.1 g/dL (ref 12.0–16.0)
LYMPHS PCT: 36 %
Lymphs Abs: 3.5 10*3/uL (ref 1.0–3.6)
MCH: 30.8 pg (ref 26.0–34.0)
MCHC: 33 g/dL (ref 32.0–36.0)
MCV: 93.4 fL (ref 80.0–100.0)
MONO ABS: 0.6 10*3/uL (ref 0.2–0.9)
Monocytes Relative: 7 %
Neutro Abs: 5.4 10*3/uL (ref 1.4–6.5)
Neutrophils Relative %: 55 %
PLATELETS: 237 10*3/uL (ref 150–440)
RBC: 4.25 MIL/uL (ref 3.80–5.20)
RDW: 13.8 % (ref 11.5–14.5)
WBC: 9.8 10*3/uL (ref 3.6–11.0)

## 2015-07-31 MED ORDER — INFLUENZA VAC SPLIT QUAD 0.5 ML IM SUSY
0.5000 mL | PREFILLED_SYRINGE | Freq: Once | INTRAMUSCULAR | Status: AC
Start: 2015-07-31 — End: 2015-07-31
  Administered 2015-07-31: 0.5 mL via INTRAMUSCULAR

## 2015-07-31 NOTE — Progress Notes (Signed)
Patient does have living will. Former smoker.  Patient has had bronchitis for past few weeks.

## 2015-07-31 NOTE — Progress Notes (Signed)
[place] @ [facility] [phone]  Fax:([phone]) [place]: 08/26/1932  MR#: [id number]  ZYY#:[id number]  Patient Care Team: Marinda Elk, MD as PCP - General (Physician Assistant) Christene Lye, MD (General Surgery) Forest Gleason, MD (Unknown Physician Specialty)  CHIEF COMPLAINT:  Chief Complaint  Patient presents with  . OTHER    Chief Complaint/Diagnosis:   1. Diffuse large cell lymphoma stage III, status post 7 cycles of chemotherapy with R. CHOP.  Diagnosis was in 2010 INTERVAL HISTORY:   81 year old lady with a history of f diffuse large cell lymphoma H3 disease.  Status post chemotherapy Came today further follow-up.  Most the side effect of chemotherapy is now resolving.  No numbness.  Does not have any abdominal pain no chills no fever. Here for further follow-up and treatment consideration 2 weeks ago patient had a symptoms of cough.  Patient went to acute care received amoxicillin and prednisone. No x-rays were done patient reports improvement  REVIEW OF SYSTEMS:   GENERAL:  Feels good.  Active.  No fevers, sweats or weight loss. PERFORMANCE STATUS (ECOG)01 HEENT:  No visual changes, runny nose, sore throat, mouth sores or tenderness. Lungs: No shortness of breath or cough.  No hemoptysis. Cardiac:  No chest pain, palpitations, orthopnea, or PND. GI:  No nausea, vomiting, diarrhea, constipation, melena or hematochezia. GU:  No urgency, frequency, dysuria, or hematuria. Musculoskeletal:  No back pain.  No joint pain.  No muscle tenderness. Extremities:  No pain or swelling. Skin:  No rashes or skin changes. Neuro:  No headache, numbness or weakness, balance or coordination issues. Endocrine:  No diabetes, thyroid issues, hot flashes or night sweats. Psych:  No mood changes, depression or anxiety. Pain:  No focal pain. Review of systems:  All other systems reviewed and found to be negative. As per HPI. Otherwise, a  complete review of systems is negatve.  PAST MEDICAL HISTORY: Past Medical History  Diagnosis Date  . Kidney stone   . Thyroid disease   . Hypertension   . Hernia   . Cancer     non hodgkins lymphoma    PAST SURGICAL HISTORY: Past Surgical History  Procedure Laterality Date  . Abdominal hysterectomy  1938  . Insertion central venous access device w/ subcutaneous port  2010    Dr Jamal Collin  . Cholecystectomy  1991  . Upper gi endoscopy  1981  . Colonoscopy  2010  . Tonsillectomy      Allergies:  Codeine: N/V  Sulfa drugs: Itching, Rash  Levaquin: Insomnia  Seafood: Swelling  Phenergan: Unknown  Strawberry: Unknown  Fish: Angioedema  Tea: Hives  Cipro: Other  Significant History/PMH:   HTN:    non hodgkins lymphoma:    Kidney Stones:    Hypothyroidism:    Gall Bladder:    Hysterectomy - Total:   Smoking History: Smoking History Never Smoked.  PFSH: Family History: No family history of colorectal cancer, breast cancer, or ovarian cancer.  Comments: mother had high cholesterol  Social History: does not smoke.  Does not drink  Additional Past Medical and Surgical History: history of bladder surgery   Hysterectomy   Cholecystectomy   Hypertension   Hypothyroidism   ADVANCED DIRECTIVES:  Patient does have advance healthcare directive, Patient   does not desire to make any changes  HEALTH MAINTENANCE: Social History  Substance Use Topics  . Smoking status: Former Research scientist (life sciences)  . Smokeless tobacco: None  . Alcohol Use: No  Allergies  Allergen Reactions  . Fish Allergy Anaphylaxis  . Codeine Itching and Nausea Only  . Ephedrine Itching and Nausea Only  . Phenergan [Promethazine Hcl] Itching and Nausea Only  . Sulphur [Sulfur] Itching and Nausea Only  . Other Rash    Blistering rash from sun exposure  . Strawberry Rash    Current Outpatient Prescriptions  Medication Sig Dispense Refill  . aspirin EC 81 MG tablet Take 81 mg by mouth.    .  BENICAR 20 MG tablet Take 20 mg by mouth daily.     . Biotin 10 MG TABS Take by mouth daily.    Marland Kitchen co-enzyme Q-10 30 MG capsule Take 30 mg by mouth daily.    . Cyanocobalamin (VITAMIN B 12 PO) Take by mouth daily.    . hydrochlorothiazide (HYDRODIURIL) 25 MG tablet Take 25 mg by mouth daily.     Marland Kitchen levothyroxine (SYNTHROID, LEVOTHROID) 100 MCG tablet Take 100 mcg by mouth daily before breakfast.     . Multiple Vitamins-Minerals (WOMENS 50+ MULTI VITAMIN/MIN PO) Take by mouth daily.    Marland Kitchen omega-3 acid ethyl esters (LOVAZA) 1 G capsule Take 1 g by mouth daily.    Marland Kitchen triamcinolone cream (KENALOG) 0.1 % Apply 1 application topically as needed.     Marland Kitchen omeprazole (PRILOSEC) 20 MG capsule Take 20 mg by mouth daily.      No current facility-administered medications for this visit.    OBJECTIVE:  Filed Vitals:   07/31/15 1120  BP: 151/68  Pulse: 66  Temp: 94.8 F (34.9 C)     Body mass index is 33.13 kg/(m^2).    ECOG FS:1 - Symptomatic but completely ambulatory  PHYSICAL EXAM: GENERAL:  Well developed, well nourished, sitting comfortably in the exam room in no acute distress. MENTAL STATUS:  Alert and oriented to person, place and time.  RESPIRATORY:  Clear to auscultation without rales, wheezes or rhonchi. CARDIOVASCULAR:  Regular rate and rhythm without murmur, rub or gallop.  ABDOMEN:  Soft, non-tender, with active bowel sounds, and no hepatosplenomegaly.  No masses. BACK:  No CVA tenderness.  No tenderness on percussion of the back or rib cage. SKIN:  No rashes, ulcers or lesions. EXTREMITIES: No edema, no skin discoloration or tenderness.  No palpable cords. LYMPH NODES: No palpable cervical, supraclavicular, axillary or inguinal adenopathy  NEUROLOGICAL: Unremarkable. PSYCH:  Appropriate.   LAB RESULTS:  CBC Latest Ref Rng 07/31/2015 06/11/2015  WBC 3.6 - 11.0 K/uL 9.8 6.1  Hemoglobin 12.0 - 16.0 g/dL 13.1 13.3  Hematocrit 35.0 - 47.0 % 39.7 39.9  Platelets 150 - 440 K/uL 237 205      Appointment on 07/31/2015  Component Date Value Ref Range Status  . WBC 07/31/2015 9.8  3.6 - 11.0 K/uL Final  . RBC 07/31/2015 4.25  3.80 - 5.20 MIL/uL Final  . Hemoglobin 07/31/2015 13.1  12.0 - 16.0 g/dL Final  . HCT 07/31/2015 39.7  35.0 - 47.0 % Final  . MCV 07/31/2015 93.4  80.0 - 100.0 fL Final  . MCH 07/31/2015 30.8  26.0 - 34.0 pg Final  . MCHC 07/31/2015 33.0  32.0 - 36.0 g/dL Final  . RDW 07/31/2015 13.8  11.5 - 14.5 % Final  . Platelets 07/31/2015 237  150 - 440 K/uL Final  . Neutrophils Relative % 07/31/2015 55   Final  . Neutro Abs 07/31/2015 5.4  1.4 - 6.5 K/uL Final  . Lymphocytes Relative 07/31/2015 36   Final  . Lymphs Abs 07/31/2015  3.5  1.0 - 3.6 K/uL Final  . Monocytes Relative 07/31/2015 7   Final  . Monocytes Absolute 07/31/2015 0.6  0.2 - 0.9 K/uL Final  . Eosinophils Relative 07/31/2015 1   Final  . Eosinophils Absolute 07/31/2015 0.1  0 - 0.7 K/uL Final  . Basophils Relative 07/31/2015 1   Final  . Basophils Absolute 07/31/2015 0.1  0 - 0.1 K/uL Final  . Sodium 07/31/2015 139  135 - 145 mmol/L Final  . Potassium 07/31/2015 4.7  3.5 - 5.1 mmol/L Final  . Chloride 07/31/2015 104  101 - 111 mmol/L Final  . CO2 07/31/2015 27  22 - 32 mmol/L Final  . Glucose, Bld 07/31/2015 101* 65 - 99 mg/dL Final  . BUN 07/31/2015 34* 6 - 20 mg/dL Final  . Creatinine, Ser 07/31/2015 1.20* 0.44 - 1.00 mg/dL Final  . Calcium 07/31/2015 9.6  8.9 - 10.3 mg/dL Final  . Total Protein 07/31/2015 7.0  6.5 - 8.1 g/dL Final  . Albumin 07/31/2015 4.2  3.5 - 5.0 g/dL Final  . AST 07/31/2015 29  15 - 41 U/L Final  . ALT 07/31/2015 17  14 - 54 U/L Final  . Alkaline Phosphatase 07/31/2015 59  38 - 126 U/L Final  . Total Bilirubin 07/31/2015 0.9  0.3 - 1.2 mg/dL Final  . GFR calc non Af Amer 07/31/2015 42* >60 mL/min Final  . GFR calc Af Amer 07/31/2015 48* >60 mL/min Final   Comment: (NOTE) The eGFR has been calculated using the CKD EPI equation. This calculation has not been  validated in all clinical situations. eGFR's persistently <60 mL/min signify possible Chronic Kidney Disease.   . Anion gap 07/31/2015 8  5 - 15 Final  . LDH 07/31/2015 131  98 - 192 U/L Final        ASSESSMENT: Diffuse  B large cell lymphoma on clinical examination by reviewing lab data there is no evidence of recurrent disease. Patient is getting regular mammogram done  All lab data has been reviewed.  Serum creatinine is slightly elevated with BUN is slightly up.  May be due to dehydration. Is and was advised to drink a lot of fluid Primary care physicians would recheck serum creatinine and BUN and if it continues to go up nephrological evaluation can be planned MEDICAL DECISION MAKING:  No further x-ray or CAT scan is needed. Reevaluation in 1 year now. Patient was given flu shot today Will send note to primary care physician  Patient might have episode of acute bronchitis a few weeks ago was treated with amoxicillin and prednisone and patient reports improvement.  Clinical examination does not reveal any rhonchi.  Patient did not have any chest x-ray done.  If patient continues to have symptoms chest x-ray would be done  Patient expressed understanding and was in agreement with this plan. She also understands that She can call clinic at any time with any questions, concerns, or complaints.    No matching staging information was found for the patient.  Forest Gleason, MD   07/31/2015 11:46 AM

## 2015-11-28 ENCOUNTER — Other Ambulatory Visit: Payer: Self-pay | Admitting: Physician Assistant

## 2015-11-28 DIAGNOSIS — Z1231 Encounter for screening mammogram for malignant neoplasm of breast: Secondary | ICD-10-CM

## 2015-12-10 ENCOUNTER — Ambulatory Visit
Admission: RE | Admit: 2015-12-10 | Discharge: 2015-12-10 | Disposition: A | Discharge status: Home / Self Care | Payer: Medicare Other | Source: Ambulatory Visit | Attending: Physician Assistant | Admitting: Physician Assistant

## 2015-12-10 ENCOUNTER — Other Ambulatory Visit: Payer: Self-pay | Admitting: Physician Assistant

## 2015-12-10 DIAGNOSIS — Z1231 Encounter for screening mammogram for malignant neoplasm of breast: Secondary | ICD-10-CM

## 2015-12-30 ENCOUNTER — Encounter: Payer: Self-pay | Admitting: *Deleted

## 2015-12-30 ENCOUNTER — Ambulatory Visit
Admission: EM | Admit: 2015-12-30 | Discharge: 2015-12-30 | Disposition: A | Discharge status: Home / Self Care | Payer: Medicare Other | Attending: Family Medicine | Admitting: Family Medicine

## 2015-12-30 DIAGNOSIS — R21 Rash and other nonspecific skin eruption: Secondary | ICD-10-CM | POA: Diagnosis not present

## 2015-12-30 MED ORDER — PREDNISONE 10 MG PO TABS: ORAL_TABLET | ORAL | Status: DC

## 2015-12-30 MED ORDER — VALACYCLOVIR HCL 1 G PO TABS
1000.0000 mg | ORAL_TABLET | Freq: Three times a day (TID) | ORAL | Status: AC
Start: 2015-12-30 — End: 2016-01-13

## 2015-12-30 NOTE — Discharge Instructions (Signed)
Take medication as prescribed. Rest. Drink plenty of fluids. Monitor area closely. Avoid scratching.    Follow up with your primary care physician this week in 2-3 days as discussed.   Return to Urgent care or ER for new or worsening concerns.

## 2015-12-30 NOTE — ED Provider Notes (Signed)
[facility]  ____________________________________________  Time seen: Approximately 12:11 PM  I have reviewed the triage vital signs and the nursing notes.   HISTORY  Chief Complaint  Rash   HPI Rebecca Stokes is a 82 y.o. female presents for the complaint of rash to left upper arm 5 days. Patient reports that she is unsure of the known trigger. Patient reports that the rash was gradual onset and started on the left upper arm and has progressed spreading downwards left arm. States that the rash does itch and states that when she scratches it has a slight burning sensation. Denies pain. Denies numbness or tingling sensation. Denies other rash. Denies being outside. Denies insect or tick bite or exposure.   Denies any changes in medicines, foods, lotions, detergents, contacts. Reports that she does have a small dog that sleeps in the bed with her at night but states that that is been the case for months, without rash or changes. Reports that she did have a grandson that had scabies but that was 6 months ago. Denies any other changes.  Reports continues to eat and drink well. Denies fevers. Denies chest pain, shortness of breath, dizziness, weakness, or other complaints. States that she feels well. Patient reports that she is using over-the-counter oral Benadryl as well as topical hydrocortisone without resolution.  PCP: Lupe Carney   Past Medical History  Diagnosis Date  . Kidney stone   . Thyroid disease   . Hypertension   . Hernia   . Cancer ([place])     non hodgkins lymphoma    Patient Active Problem List   Diagnosis Date Noted  . Anxiety 07/31/2015  . Colon polyp 07/31/2015  . Compression fracture 07/31/2015  . Diverticulitis of colon 07/31/2015  . DD (diverticular disease) 07/31/2015  . Cephalalgia 07/31/2015  . H/O disease 07/31/2015  . Adult hypothyroidism 07/31/2015  . Hypercholesterolemia without hypertriglyceridemia 07/31/2015  . Acute upper respiratory  infection 11/07/2014  . Arteriosclerosis of coronary artery 08/23/2014  . BP (high blood pressure) 05/23/2014  . History of non-Hodgkin's lymphoma 10/24/2013  . Abnormal findings on diagnostic imaging of heart and coronary circulation 02/06/2013  . Acute cystitis 02/02/2013  . Abdominal pain, right lower quadrant 12/23/2012  . Calculus of kidney 12/23/2012  . Lymphoma of extranodal and solid organ sites [facility]) 12/23/2012  . Lymphoma, non-Hodgkin's ([place]) 12/23/2012  . Neuralgia neuritis, sciatic nerve 12/23/2012  . Urge incontinence 12/23/2012  . DHL (diffuse histiocytic lymphoma) ([place]) 06/09/2009    Past Surgical History  Procedure Laterality Date  . Abdominal hysterectomy  1938  . Insertion central venous access device w/ subcutaneous port  2010    Dr Jamal Collin  . Cholecystectomy  1991  . Upper gi endoscopy  1981  . Colonoscopy  2010  . Tonsillectomy      Current Outpatient Rx  Name  Route  Sig  Dispense  Refill  . aspirin EC 81 MG tablet   Oral   Take 81 mg by mouth.         . BENICAR 20 MG tablet   Oral   Take 20 mg by mouth daily.          . Biotin 10 MG TABS   Oral   Take by mouth daily.         Marland Kitchen co-enzyme Q-10 30 MG capsule   Oral   Take 30 mg by mouth daily.         . Cyanocobalamin (VITAMIN B 12 PO)  Oral   Take by mouth daily.         . hydrochlorothiazide (HYDRODIURIL) 25 MG tablet   Oral   Take 25 mg by mouth daily.          Marland Kitchen levothyroxine (SYNTHROID, LEVOTHROID) 100 MCG tablet   Oral   Take 100 mcg by mouth daily before breakfast.          . Multiple Vitamins-Minerals (WOMENS 50+ MULTI VITAMIN/MIN PO)   Oral   Take by mouth daily.         Marland Kitchen omega-3 acid ethyl esters (LOVAZA) 1 G capsule   Oral   Take 1 g by mouth daily.         Marland Kitchen omeprazole (PRILOSEC) 20 MG capsule   Oral   Take 20 mg by mouth daily.          Marland Kitchen triamcinolone cream (KENALOG) 0.1 %   Topical   Apply 1 application topically as needed.             Allergies Fish allergy; Ciprofloxacin; Codeine; Ephedrine; Phenergan; Sulphur; Other; and Strawberry extract  History reviewed. No pertinent family history.  Social History Social History  Substance Use Topics  . Smoking status: Former Research scientist (life sciences)  . Smokeless tobacco: None  . Alcohol Use: No    Review of Systems Constitutional: No fever/chills Eyes: No visual changes. ENT: No sore throat. Cardiovascular: Denies chest pain. Respiratory: Denies shortness of breath. Gastrointestinal: No abdominal pain.  No nausea, no vomiting.  No diarrhea.  No constipation. Genitourinary: Negative for dysuria. Musculoskeletal: Negative for back pain. Skin: positive for rash. Neurological: Negative for headaches, focal weakness or numbness.  10-point ROS otherwise negative.  ____________________________________________   PHYSICAL EXAM:  VITAL SIGNS: ED Triage Vitals  Enc Vitals Group     BP 12/30/15 1132 147/73 mmHg     Pulse Rate 12/30/15 1132 55     Resp 12/30/15 1132 16     Temp 12/30/15 1132 97.7 F (36.5 C)     Temp Source 12/30/15 1132 Oral     SpO2 12/30/15 1132 100 %     Weight 12/30/15 1132 168 lb (76.204 kg)     Height 12/30/15 1132 5\' 1"  (1.549 m)     Head Cir --      Peak Flow --      Pain Score --      Pain Loc --      Pain Edu? --      Excl. in [place]? --     Constitutional: Alert and oriented. Well appearing and in no acute distress. Eyes: Conjunctivae are normal. PERRL. EOMI. Head: Atraumatic.   Nose: No congestion/rhinnorhea.  Mouth/Throat: Mucous membranes are moist.  Oropharynx non-erythematous. Neck: No stridor.  No cervical spine tenderness to palpation. Hematological/Lymphatic/Immunilogical: No cervical lymphadenopathy. Cardiovascular: Normal rate, regular rhythm. Grossly normal heart sounds.  Good peripheral circulation. Respiratory: Normal respiratory effort.  No retractions. Lungs CTAB. Gastrointestinal: Soft and nontender. Normal Bowel sounds. No CVA  tenderness. Musculoskeletal: No lower or upper extremity tenderness nor edema. Bilateral pedal pulses equal and easily palpated.  Neurologic:  Normal speech and language. No gross focal neurologic deficits are appreciated. No gait instability. Skin:  Skin is warm, dry and intact. No rash noted.  Except: left arm erythematous papular pruritic rash to left upper arm mid humerus distally to mid left forearm, no surrounding erythema, no induration, no fluctuance, non-vesicular, no swelling. Distal radial pulses equal bilateral , bilateral hand grips equal and strong. No  other rash visualized.   Psychiatric: Mood and affect are normal. Speech and behavior are normal.  ____________________________________________   LABS (all labs ordered are listed, but only abnormal results are displayed)  Labs Reviewed - No data to display  INITIAL IMPRESSION / ASSESSMENT AND PLAN / ED COURSE  Pertinent labs & imaging results that were available during my care of the patient were reviewed by me and considered in my medical decision making (see chart for details).  Very well-appearing patient. No acute distress. Presents for the complaint of rash to left arm 5 days. Denies known trigger. Denies other rash. Patient with left arm erythematous papular pruritic rash, no surrounding erythema, no induration, no fluctuance, non-vesicular, rash following C6 dermatome. Clinical appearance suspicious for contact dermatitis however following C6 dermatome suspicious for shingles rash. Will treat with oral valacyclovir or and prednisone taper. Encouraged patient to her monitor closely as well as monitor for trigger. Encouraged close to the follow-up this week.  Discussed follow up with Primary care physician this week. Discussed follow up and return parameters including no resolution or any worsening concerns. Patient verbalized understanding and agreed to plan.   ____________________________________________   FINAL  CLINICAL IMPRESSION(S) / ED DIAGNOSES  Final diagnoses:  Rash      Note: This dictation was prepared with Dragon dictation along with smaller phrase technology. Any transcriptional errors that result from this process are unintentional.    Marylene Land, NP 12/30/15 1313

## 2015-12-30 NOTE — ED Notes (Signed)
Rash and itching to left arm x5 days. Denies involvement in other area of her body.

## 2016-07-30 ENCOUNTER — Ambulatory Visit: Payer: Medicare Other | Admitting: Oncology

## 2016-07-30 ENCOUNTER — Inpatient Hospital Stay: Payer: Medicare Other

## 2016-07-30 ENCOUNTER — Inpatient Hospital Stay: Payer: Medicare Other | Admitting: Internal Medicine

## 2016-07-30 ENCOUNTER — Other Ambulatory Visit: Payer: Medicare Other

## 2016-08-03 ENCOUNTER — Other Ambulatory Visit: Payer: Self-pay

## 2016-08-03 DIAGNOSIS — C8599 Non-Hodgkin lymphoma, unspecified, extranodal and solid organ sites: Secondary | ICD-10-CM

## 2016-08-05 ENCOUNTER — Encounter (INDEPENDENT_AMBULATORY_CARE_PROVIDER_SITE_OTHER): Payer: Self-pay

## 2016-08-05 ENCOUNTER — Inpatient Hospital Stay (HOSPITAL_BASED_OUTPATIENT_CLINIC_OR_DEPARTMENT_OTHER): Payer: Medicare Other

## 2016-08-05 ENCOUNTER — Encounter: Payer: Self-pay | Admitting: Internal Medicine

## 2016-08-05 ENCOUNTER — Inpatient Hospital Stay: Payer: Medicare Other | Attending: Internal Medicine | Admitting: Internal Medicine

## 2016-08-05 DIAGNOSIS — N183 Chronic kidney disease, stage 3 (moderate): Secondary | ICD-10-CM | POA: Diagnosis not present

## 2016-08-05 DIAGNOSIS — I129 Hypertensive chronic kidney disease with stage 1 through stage 4 chronic kidney disease, or unspecified chronic kidney disease: Secondary | ICD-10-CM | POA: Insufficient documentation

## 2016-08-05 DIAGNOSIS — Z79899 Other long term (current) drug therapy: Secondary | ICD-10-CM | POA: Insufficient documentation

## 2016-08-05 DIAGNOSIS — E079 Disorder of thyroid, unspecified: Secondary | ICD-10-CM | POA: Diagnosis not present

## 2016-08-05 DIAGNOSIS — C8330 Diffuse large B-cell lymphoma, unspecified site: Secondary | ICD-10-CM

## 2016-08-05 DIAGNOSIS — Z87891 Personal history of nicotine dependence: Secondary | ICD-10-CM | POA: Diagnosis not present

## 2016-08-05 DIAGNOSIS — Z87442 Personal history of urinary calculi: Secondary | ICD-10-CM | POA: Insufficient documentation

## 2016-08-05 DIAGNOSIS — Z7982 Long term (current) use of aspirin: Secondary | ICD-10-CM | POA: Insufficient documentation

## 2016-08-05 DIAGNOSIS — R634 Abnormal weight loss: Secondary | ICD-10-CM | POA: Diagnosis not present

## 2016-08-05 DIAGNOSIS — C8599 Non-Hodgkin lymphoma, unspecified, extranodal and solid organ sites: Secondary | ICD-10-CM

## 2016-08-05 DIAGNOSIS — Z9221 Personal history of antineoplastic chemotherapy: Secondary | ICD-10-CM | POA: Diagnosis not present

## 2016-08-05 LAB — COMPREHENSIVE METABOLIC PANEL
ALT: 16 U/L (ref 14–54)
AST: 32 U/L (ref 15–41)
Albumin: 4.3 g/dL (ref 3.5–5.0)
Alkaline Phosphatase: 57 U/L (ref 38–126)
Anion gap: 7 (ref 5–15)
BUN: 31 mg/dL — AB (ref 6–20)
CHLORIDE: 104 mmol/L (ref 101–111)
CO2: 27 mmol/L (ref 22–32)
Calcium: 9.6 mg/dL (ref 8.9–10.3)
Creatinine, Ser: 1.27 mg/dL — ABNORMAL HIGH (ref 0.44–1.00)
GFR, EST AFRICAN AMERICAN: 45 mL/min — AB (ref >60)
GFR, EST NON AFRICAN AMERICAN: 38 mL/min — AB (ref >60)
Glucose, Bld: 115 mg/dL — ABNORMAL HIGH (ref 65–99)
POTASSIUM: 4.1 mmol/L (ref 3.5–5.1)
Sodium: 138 mmol/L (ref 135–145)
Total Bilirubin: 0.5 mg/dL (ref 0.3–1.2)
Total Protein: 7.2 g/dL (ref 6.5–8.1)

## 2016-08-05 LAB — CBC WITH DIFFERENTIAL/PLATELET
Basophils Absolute: 0.1 10*3/uL (ref 0–0.1)
Basophils Relative: 1 %
EOS PCT: 4 %
Eosinophils Absolute: 0.3 10*3/uL (ref 0–0.7)
HCT: 36.7 % (ref 35.0–47.0)
Hemoglobin: 12.6 g/dL (ref 12.0–16.0)
LYMPHS ABS: 2.7 10*3/uL (ref 1.0–3.6)
LYMPHS PCT: 35 %
MCH: 31.7 pg (ref 26.0–34.0)
MCHC: 34.4 g/dL (ref 32.0–36.0)
MCV: 92.4 fL (ref 80.0–100.0)
MONO ABS: 0.5 10*3/uL (ref 0.2–0.9)
MONOS PCT: 6 %
Neutro Abs: 4.2 10*3/uL (ref 1.4–6.5)
Neutrophils Relative %: 54 %
PLATELETS: 220 10*3/uL (ref 150–440)
RBC: 3.97 MIL/uL (ref 3.80–5.20)
RDW: 14 % (ref 11.5–14.5)
WBC: 7.8 10*3/uL (ref 3.6–11.0)

## 2016-08-05 NOTE — Assessment & Plan Note (Addendum)
Diffuse large lymphoma status post CHOP chemotherapy 2010. ? Recurrence based on signficant weight loss.   # weight loss- ~95 pounds/ 1year/ early satiety-vs ? Intentional. Await PET scan.   # CKD stage III  # follow up in 2 weeks/ no labs; scan before.

## 2016-08-05 NOTE — Progress Notes (Signed)
[place] OFFICE PROGRESS NOTE  Patient Care Team: Marinda Elk, MD as PCP - General (Physician Assistant)  No matching staging information was found for the patient.   Oncology History   # 2010- Diffuse large cell lymphoma stage III, status post 7 cycles of chemotherapy with R. CHOP.       DLBCL (diffuse large B cell lymphoma) ([place])   08/05/2016 Initial Diagnosis    DLBCL (diffuse large B cell lymphoma) ([place])       This is my first interaction with the patient as patient's primary oncologist has been Dr.Choksi. I reviewed the patient's prior charts/pertinent labs/imaging in detail; findings are summarized above.     INTERVAL HISTORY:  Rebecca Stokes 82 y.o.  female pleasant patient above history of Diffuse large B-cell lymphoma status post R CHOP chemotherapy 2010 is here for follow-up.  Patient states to poor appetite. Early satiety. No nausea no vomiting. Positive for weight loss of about 95 pounds in the last 1 year. No night sweats. No fevers. No lumps or bumps. Fairly active for age.  REVIEW OF SYSTEMS:  A complete 10 point review of system is done which is negative except mentioned above/history of present illness.   PAST MEDICAL HISTORY :  Past Medical History:  Diagnosis Date  . Cancer ([place])    non hodgkins lymphoma  . Hernia   . Hypertension   . Kidney stone   . Thyroid disease     PAST SURGICAL HISTORY :   Past Surgical History:  Procedure Laterality Date  . ABDOMINAL HYSTERECTOMY  1938  . CHOLECYSTECTOMY  1991  . COLONOSCOPY  2010  . INSERTION CENTRAL VENOUS ACCESS DEVICE W/ SUBCUTANEOUS PORT  2010   Dr Jamal Collin  . TONSILLECTOMY    . UPPER GI ENDOSCOPY  1981    FAMILY HISTORY :  No family history on file.  SOCIAL HISTORY:   Social History  Substance Use Topics  . Smoking status: Former Research scientist (life sciences)  . Smokeless tobacco: Never Used  . Alcohol use No    ALLERGIES:  is allergic to fish allergy; ciprofloxacin; codeine; ephedrine;  phenergan [promethazine hcl]; sulphur [sulfur]; other; and strawberry extract.  MEDICATIONS:  Current Outpatient Prescriptions  Medication Sig Dispense Refill  . aspirin EC 81 MG tablet Take 81 mg by mouth.    . BENICAR 20 MG tablet Take 20 mg by mouth daily.     . Biotin 10 MG TABS Take by mouth daily.    . Calcium-Phosphorus-Vitamin D (CITRACAL +D3 PO) Take by mouth.    . co-enzyme Q-10 30 MG capsule Take 30 mg by mouth daily.    . Cyanocobalamin (VITAMIN B 12 PO) Take by mouth daily.    Marland Kitchen GNP GARLIC EXTRACT PO Take [id number] mg by mouth.    . hydrochlorothiazide (HYDRODIURIL) 25 MG tablet Take 25 mg by mouth daily.     Marland Kitchen levothyroxine (SYNTHROID, LEVOTHROID) 100 MCG tablet Take 100 mcg by mouth daily before breakfast.     . Multiple Vitamins-Minerals (WOMENS 50+ MULTI VITAMIN/MIN PO) Take by mouth daily.    Marland Kitchen omega-3 acid ethyl esters (LOVAZA) 1 G capsule Take 1 g by mouth daily.    . pantoprazole (PROTONIX) 40 MG tablet     . pyridoxine (B-6) 100 MG tablet Take 100 mg by mouth daily.    . ranitidine (ZANTAC) 150 MG capsule Take 150 mg by mouth 2 (two) times daily.     No current facility-administered medications for this visit.  PHYSICAL EXAMINATION: ECOG PERFORMANCE STATUS: 0 - Asymptomatic  BP 109/69 (BP Location: Right Arm, Patient Position: Sitting)   Pulse 61   Temp (!) 96.6 F (35.9 C) (Tympanic)   Resp (!) 61   Ht 5\' 1"  (1.549 m)   Wt 167 lb 6.4 oz (75.9 kg)   BMI 31.63 kg/m   Filed Weights   08/05/16 1520  Weight: 167 lb 6.4 oz (75.9 kg)    GENERAL: Well-nourished well-developed; Alert, no distress and comfortable.  Alone.  EYES: no pallor or icterus OROPHARYNX: no thrush or ulceration; good dentition  NECK: supple, no masses felt LYMPH:  no palpable lymphadenopathy in the cervical, axillary or inguinal regions LUNGS: clear to auscultation and  No wheeze or crackles HEART/CVS: regular rate & rhythm and no murmurs; No lower extremity edema ABDOMEN:abdomen  soft, non-tender and normal bowel sounds Musculoskeletal:no cyanosis of digits and no clubbing  PSYCH: alert & oriented x 3 with fluent speech NEURO: no focal motor/sensory deficits SKIN:  no rashes or significant lesions  LABORATORY DATA:  I have reviewed the data as listed    Component Value Date/Time   NA 138 08/05/2016 1447   NA 141 07/27/2014 1029   K 4.1 08/05/2016 1447   K 3.7 07/27/2014 1029   CL 104 08/05/2016 1447   CL 104 07/27/2014 1029   CO2 27 08/05/2016 1447   CO2 30 07/27/2014 1029   GLUCOSE 115 (H) 08/05/2016 1447   GLUCOSE 136 (H) 07/27/2014 1029   BUN 31 (H) 08/05/2016 1447   BUN 22 (H) 07/27/2014 1029   CREATININE 1.27 (H) 08/05/2016 1447   CREATININE 1.40 (H) 07/27/2014 1029   CALCIUM 9.6 08/05/2016 1447   CALCIUM 9.0 07/27/2014 1029   PROT 7.2 08/05/2016 1447   PROT 6.9 07/27/2014 1029   ALBUMIN 4.3 08/05/2016 1447   ALBUMIN 3.6 07/27/2014 1029   AST 32 08/05/2016 1447   AST 35 07/27/2014 1029   ALT 16 08/05/2016 1447   ALT 30 07/27/2014 1029   ALKPHOS 57 08/05/2016 1447   ALKPHOS 78 07/27/2014 1029   BILITOT 0.5 08/05/2016 1447   BILITOT 0.6 07/27/2014 1029   GFRNONAA 38 (L) 08/05/2016 1447   GFRNONAA 36 (L) 07/27/2014 1029   GFRAA 45 (L) 08/05/2016 1447   GFRAA 41 (L) 07/27/2014 1029    No results found for: SPEP, UPEP  Lab Results  Component Value Date   WBC 7.8 08/05/2016   NEUTROABS 4.2 08/05/2016   HGB 12.6 08/05/2016   HCT 36.7 08/05/2016   MCV 92.4 08/05/2016   PLT 220 08/05/2016      Chemistry      Component Value Date/Time   NA 138 08/05/2016 1447   NA 141 07/27/2014 1029   K 4.1 08/05/2016 1447   K 3.7 07/27/2014 1029   CL 104 08/05/2016 1447   CL 104 07/27/2014 1029   CO2 27 08/05/2016 1447   CO2 30 07/27/2014 1029   BUN 31 (H) 08/05/2016 1447   BUN 22 (H) 07/27/2014 1029   CREATININE 1.27 (H) 08/05/2016 1447   CREATININE 1.40 (H) 07/27/2014 1029      Component Value Date/Time   CALCIUM 9.6 08/05/2016 1447    CALCIUM 9.0 07/27/2014 1029   ALKPHOS 57 08/05/2016 1447   ALKPHOS 78 07/27/2014 1029   AST 32 08/05/2016 1447   AST 35 07/27/2014 1029   ALT 16 08/05/2016 1447   ALT 30 07/27/2014 1029   BILITOT 0.5 08/05/2016 1447   BILITOT 0.6 07/27/2014 1029  RADIOGRAPHIC STUDIES: I have personally reviewed the radiological images as listed and agreed with the findings in the report. No results found.   ASSESSMENT & PLAN:  DLBCL (diffuse large B cell lymphoma) (HCC) Diffuse large lymphoma status post CHOP chemotherapy 2010. ? Recurrence based on signficant weight loss.   # weight loss- ~95 pounds/ 1year/ early satiety-vs ? Intentional. Await PET scan.   # CKD stage III  # follow up in 2 weeks/ no labs; scan before.    Orders Placed This Encounter  Procedures  . NM PET Image Restag (PS) Skull Base To Thigh    Standing Status:   Future    Standing Expiration Date:   10/05/2017    Order Specific Question:   Reason for Exam (SYMPTOM  OR DIAGNOSIS REQUIRED)    Answer:   diffuse large b cell lymphoma    Order Specific Question:   Preferred imaging location?    Answer:   [facility]   All questions were answered. The patient knows to call the clinic with any problems, questions or concerns.      Cammie Sickle, MD 08/05/2016 3:43 PM

## 2016-08-05 NOTE — Progress Notes (Signed)
Pt has lost weight between 90-95 lbs over the last year.  Feels great per pt.  Exercises at home and is very active

## 2016-08-12 ENCOUNTER — Ambulatory Visit
Admission: RE | Admit: 2016-08-12 | Discharge: 2016-08-12 | Disposition: A | Discharge status: Home / Self Care | Payer: Medicare Other | Source: Ambulatory Visit | Attending: Internal Medicine | Admitting: Internal Medicine

## 2016-08-12 DIAGNOSIS — C8330 Diffuse large B-cell lymphoma, unspecified site: Secondary | ICD-10-CM | POA: Diagnosis present

## 2016-08-12 DIAGNOSIS — I2510 Atherosclerotic heart disease of native coronary artery without angina pectoris: Secondary | ICD-10-CM | POA: Insufficient documentation

## 2016-08-12 DIAGNOSIS — N200 Calculus of kidney: Secondary | ICD-10-CM | POA: Diagnosis not present

## 2016-08-12 DIAGNOSIS — I700 Atherosclerosis of aorta: Secondary | ICD-10-CM | POA: Diagnosis not present

## 2016-08-12 LAB — GLUCOSE, CAPILLARY: GLUCOSE-CAPILLARY: 112 mg/dL — AB (ref 65–99)

## 2016-08-12 MED ORDER — FLUDEOXYGLUCOSE F - 18 (FDG) INJECTION
12.0000 | Freq: Once | INTRAVENOUS | Status: AC | PRN
  Administered 2016-08-12: 12.71 via INTRAVENOUS

## 2016-08-14 ENCOUNTER — Telehealth: Payer: Self-pay | Admitting: *Deleted

## 2016-08-14 NOTE — Telephone Encounter (Signed)
Patient would like to be called with her PET Scan results. [phone].

## 2016-08-14 NOTE — Telephone Encounter (Signed)
Contacted patient back. V/o Dr. Rogue Bussing- explained that there was no evidence of recurrent lymphoma. Patient thanked me for calling her back.

## 2016-08-20 ENCOUNTER — Ambulatory Visit: Payer: Medicare Other | Admitting: Internal Medicine

## 2016-08-21 ENCOUNTER — Inpatient Hospital Stay: Payer: Medicare Other | Admitting: Internal Medicine

## 2016-12-03 ENCOUNTER — Other Ambulatory Visit: Payer: Self-pay | Admitting: Physician Assistant

## 2016-12-03 DIAGNOSIS — Z1231 Encounter for screening mammogram for malignant neoplasm of breast: Secondary | ICD-10-CM

## 2017-01-05 ENCOUNTER — Ambulatory Visit
Admission: RE | Admit: 2017-01-05 | Discharge: 2017-01-05 | Disposition: A | Discharge status: Home / Self Care | Payer: Medicare Other | Source: Ambulatory Visit | Attending: Physician Assistant | Admitting: Physician Assistant

## 2017-01-05 ENCOUNTER — Other Ambulatory Visit: Payer: Self-pay | Admitting: Physician Assistant

## 2017-01-05 DIAGNOSIS — Z1231 Encounter for screening mammogram for malignant neoplasm of breast: Secondary | ICD-10-CM | POA: Insufficient documentation

## 2017-08-18 ENCOUNTER — Inpatient Hospital Stay: Payer: Medicare Other

## 2017-08-18 ENCOUNTER — Other Ambulatory Visit: Payer: Self-pay | Admitting: *Deleted

## 2017-08-18 ENCOUNTER — Inpatient Hospital Stay: Payer: Medicare Other | Attending: Internal Medicine | Admitting: Internal Medicine

## 2017-08-18 VITALS — BP 147/84 | HR 72 | Temp 97.8°F | Resp 18 | Ht 61.0 in | Wt 168.0 lb

## 2017-08-18 DIAGNOSIS — Z8572 Personal history of non-Hodgkin lymphomas: Secondary | ICD-10-CM

## 2017-08-18 DIAGNOSIS — Z87891 Personal history of nicotine dependence: Secondary | ICD-10-CM | POA: Insufficient documentation

## 2017-08-18 DIAGNOSIS — Z87442 Personal history of urinary calculi: Secondary | ICD-10-CM | POA: Insufficient documentation

## 2017-08-18 DIAGNOSIS — N183 Chronic kidney disease, stage 3 (moderate): Secondary | ICD-10-CM | POA: Diagnosis not present

## 2017-08-18 DIAGNOSIS — C8330 Diffuse large B-cell lymphoma, unspecified site: Secondary | ICD-10-CM

## 2017-08-18 DIAGNOSIS — Z7982 Long term (current) use of aspirin: Secondary | ICD-10-CM | POA: Diagnosis not present

## 2017-08-18 DIAGNOSIS — N200 Calculus of kidney: Secondary | ICD-10-CM | POA: Diagnosis not present

## 2017-08-18 DIAGNOSIS — E079 Disorder of thyroid, unspecified: Secondary | ICD-10-CM | POA: Insufficient documentation

## 2017-08-18 DIAGNOSIS — C8333 Diffuse large B-cell lymphoma, intra-abdominal lymph nodes: Secondary | ICD-10-CM

## 2017-08-18 DIAGNOSIS — Z79899 Other long term (current) drug therapy: Secondary | ICD-10-CM | POA: Diagnosis not present

## 2017-08-18 DIAGNOSIS — Z9221 Personal history of antineoplastic chemotherapy: Secondary | ICD-10-CM | POA: Diagnosis not present

## 2017-08-18 DIAGNOSIS — I129 Hypertensive chronic kidney disease with stage 1 through stage 4 chronic kidney disease, or unspecified chronic kidney disease: Secondary | ICD-10-CM

## 2017-08-18 LAB — COMPREHENSIVE METABOLIC PANEL
ALT: 17 U/L (ref 14–54)
ANION GAP: 10 (ref 5–15)
AST: 33 U/L (ref 15–41)
Albumin: 3.9 g/dL (ref 3.5–5.0)
Alkaline Phosphatase: 63 U/L (ref 38–126)
BUN: 24 mg/dL — ABNORMAL HIGH (ref 6–20)
CHLORIDE: 100 mmol/L — AB (ref 101–111)
CO2: 29 mmol/L (ref 22–32)
Calcium: 9.4 mg/dL (ref 8.9–10.3)
Creatinine, Ser: 1.11 mg/dL — ABNORMAL HIGH (ref 0.44–1.00)
GFR, EST AFRICAN AMERICAN: 52 mL/min — AB (ref >60)
GFR, EST NON AFRICAN AMERICAN: 45 mL/min — AB (ref >60)
Glucose, Bld: 112 mg/dL — ABNORMAL HIGH (ref 65–99)
POTASSIUM: 3.5 mmol/L (ref 3.5–5.1)
Sodium: 139 mmol/L (ref 135–145)
Total Bilirubin: 0.7 mg/dL (ref 0.3–1.2)
Total Protein: 6.8 g/dL (ref 6.5–8.1)

## 2017-08-18 LAB — CBC WITH DIFFERENTIAL/PLATELET
BASOS ABS: 0.1 10*3/uL (ref 0–0.1)
Basophils Relative: 2 %
EOS PCT: 5 %
Eosinophils Absolute: 0.3 10*3/uL (ref 0–0.7)
HCT: 36.8 % (ref 35.0–47.0)
Hemoglobin: 12.5 g/dL (ref 12.0–16.0)
LYMPHS PCT: 37 %
Lymphs Abs: 2.1 10*3/uL (ref 1.0–3.6)
MCH: 31.7 pg (ref 26.0–34.0)
MCHC: 33.8 g/dL (ref 32.0–36.0)
MCV: 93.7 fL (ref 80.0–100.0)
MONO ABS: 0.4 10*3/uL (ref 0.2–0.9)
Monocytes Relative: 7 %
Neutro Abs: 2.9 10*3/uL (ref 1.4–6.5)
Neutrophils Relative %: 49 %
PLATELETS: 224 10*3/uL (ref 150–440)
RBC: 3.93 MIL/uL (ref 3.80–5.20)
RDW: 14 % (ref 11.5–14.5)
WBC: 5.8 10*3/uL (ref 3.6–11.0)

## 2017-08-18 LAB — LACTATE DEHYDROGENASE: LDH: 141 U/L (ref 98–192)

## 2017-08-18 NOTE — Progress Notes (Signed)
[facility] OFFICE PROGRESS NOTE  Patient Care Team: Marinda Elk, MD as PCP - General (Physician Assistant)  Cancer Staging No matching staging information was found for the patient.   Oncology History   # 2010- Diffuse large cell lymphoma stage III, status post 7 cycles of chemotherapy with R. CHOP.          INTERVAL HISTORY:  Rebecca Stokes 83 y.o.  female pleasant patient above history of Diffuse large B-cell lymphoma status post R CHOP chemotherapy 2010 is here for follow-up.  Patient states to poor appetite.No significant weight loss. No night sweats. No fevers. No lumps or bumps. Fairly active for age.  REVIEW OF SYSTEMS:  A complete 10 point review of system is done which is negative except mentioned above/history of present illness.   PAST MEDICAL HISTORY :  Past Medical History:  Diagnosis Date  . Cancer ([place])    non hodgkins lymphoma  . Hernia   . Hypertension   . Kidney stone   . Thyroid disease     PAST SURGICAL HISTORY :   Past Surgical History:  Procedure Laterality Date  . ABDOMINAL HYSTERECTOMY  1938  . CHOLECYSTECTOMY  1991  . COLONOSCOPY  2010  . INSERTION CENTRAL VENOUS ACCESS DEVICE W/ SUBCUTANEOUS PORT  2010   Dr Jamal Collin  . TONSILLECTOMY    . UPPER GI ENDOSCOPY  1981    FAMILY HISTORY :  No family history on file.  SOCIAL HISTORY:   Social History  Substance Use Topics  . Smoking status: Former Research scientist (life sciences)  . Smokeless tobacco: Never Used  . Alcohol use No    ALLERGIES:  is allergic to fish allergy; ciprofloxacin; codeine; ephedrine; phenergan [promethazine hcl]; shellfish allergy; sulphur [sulfur]; other; and strawberry extract.  MEDICATIONS:  Current Outpatient Prescriptions  Medication Sig Dispense Refill  . aspirin EC 81 MG tablet Take 81 mg by mouth.    . BENICAR 20 MG tablet Take 20 mg by mouth daily.     . Biotin 10 MG TABS Take by mouth daily.    . Calcium-Phosphorus-Vitamin D (CITRACAL +D3 PO) Take by  mouth.    . co-enzyme Q-10 30 MG capsule Take 30 mg by mouth daily.    . Cyanocobalamin (VITAMIN B 12 PO) Take by mouth daily.    Marland Kitchen GNP GARLIC EXTRACT PO Take 7,867 mg by mouth.    . hydrochlorothiazide (HYDRODIURIL) 25 MG tablet Take 25 mg by mouth daily.     Marland Kitchen levothyroxine (SYNTHROID, LEVOTHROID) 100 MCG tablet Take 100 mcg by mouth daily before breakfast.     . Multiple Vitamins-Minerals (WOMENS 50+ MULTI VITAMIN/MIN PO) Take by mouth daily.    Marland Kitchen omega-3 acid ethyl esters (LOVAZA) 1 G capsule Take 1 g by mouth daily.    . pantoprazole (PROTONIX) 40 MG tablet     . pyridoxine (B-6) 100 MG tablet Take 100 mg by mouth daily.    . ranitidine (ZANTAC) 150 MG capsule Take 150 mg by mouth 2 (two) times daily.     No current facility-administered medications for this visit.     PHYSICAL EXAMINATION: ECOG PERFORMANCE STATUS: 0 - Asymptomatic  BP (!) 147/84 (BP Location: Left Arm, Patient Position: Sitting)   Pulse 72   Temp 97.8 F (36.6 C) (Tympanic)   Resp 18   Ht 5\' 1"  (1.549 m)   Wt 168 lb (76.2 kg)   BMI 31.74 kg/m   Filed Weights   08/18/17 1036  Weight: 168 lb (  76.2 kg)    GENERAL: Well-nourished well-developed; Alert, no distress and comfortable.  Alone.  EYES: no pallor or icterus OROPHARYNX: no thrush or ulceration; good dentition  NECK: supple, no masses felt LYMPH:  no palpable lymphadenopathy in the cervical, axillary or inguinal regions LUNGS: clear to auscultation and  No wheeze or crackles HEART/CVS: regular rate & rhythm and no murmurs; No lower extremity edema ABDOMEN:abdomen soft, non-tender and normal bowel sounds Musculoskeletal:no cyanosis of digits and no clubbing  PSYCH: alert & oriented x 3 with fluent speech NEURO: no focal motor/sensory deficits SKIN:  no rashes or significant lesions  LABORATORY DATA:  I have reviewed the data as listed    Component Value Date/Time   NA 139 08/18/2017 1009   NA 141 07/27/2014 1029   K 3.5 08/18/2017 1009   K  3.7 07/27/2014 1029   CL 100 (L) 08/18/2017 1009   CL 104 07/27/2014 1029   CO2 29 08/18/2017 1009   CO2 30 07/27/2014 1029   GLUCOSE 112 (H) 08/18/2017 1009   GLUCOSE 136 (H) 07/27/2014 1029   BUN 24 (H) 08/18/2017 1009   BUN 22 (H) 07/27/2014 1029   CREATININE 1.11 (H) 08/18/2017 1009   CREATININE 1.40 (H) 07/27/2014 1029   CALCIUM 9.4 08/18/2017 1009   CALCIUM 9.0 07/27/2014 1029   PROT 6.8 08/18/2017 1009   PROT 6.9 07/27/2014 1029   ALBUMIN 3.9 08/18/2017 1009   ALBUMIN 3.6 07/27/2014 1029   AST 33 08/18/2017 1009   AST 35 07/27/2014 1029   ALT 17 08/18/2017 1009   ALT 30 07/27/2014 1029   ALKPHOS 63 08/18/2017 1009   ALKPHOS 78 07/27/2014 1029   BILITOT 0.7 08/18/2017 1009   BILITOT 0.6 07/27/2014 1029   GFRNONAA 45 (L) 08/18/2017 1009   GFRNONAA 36 (L) 07/27/2014 1029   GFRAA 52 (L) 08/18/2017 1009   GFRAA 41 (L) 07/27/2014 1029    No results found for: SPEP, UPEP  Lab Results  Component Value Date   WBC 5.8 08/18/2017   NEUTROABS 2.9 08/18/2017   HGB 12.5 08/18/2017   HCT 36.8 08/18/2017   MCV 93.7 08/18/2017   PLT 224 08/18/2017      Chemistry      Component Value Date/Time   NA 139 08/18/2017 1009   NA 141 07/27/2014 1029   K 3.5 08/18/2017 1009   K 3.7 07/27/2014 1029   CL 100 (L) 08/18/2017 1009   CL 104 07/27/2014 1029   CO2 29 08/18/2017 1009   CO2 30 07/27/2014 1029   BUN 24 (H) 08/18/2017 1009   BUN 22 (H) 07/27/2014 1029   CREATININE 1.11 (H) 08/18/2017 1009   CREATININE 1.40 (H) 07/27/2014 1029      Component Value Date/Time   CALCIUM 9.4 08/18/2017 1009   CALCIUM 9.0 07/27/2014 1029   ALKPHOS 63 08/18/2017 1009   ALKPHOS 78 07/27/2014 1029   AST 33 08/18/2017 1009   AST 35 07/27/2014 1029   ALT 17 08/18/2017 1009   ALT 30 07/27/2014 1029   BILITOT 0.7 08/18/2017 1009   BILITOT 0.6 07/27/2014 1029     IMPRESSION: 1. Isolated small left axillary node which demonstrates low-level, non malignant range hypermetabolism. Favored to  be reactive. 2. Otherwise, no evidence of recurrent lymphoma. 3. Incidental findings, including atherosclerosis and nonobstructive bilateral renal calculi.   Electronically Signed   By: Abigail Miyamoto M.D.   On: 08/12/2016 15:25  RADIOGRAPHIC STUDIES: I have personally reviewed the radiological images as listed and agreed  with the findings in the report. No results found.   ASSESSMENT & PLAN:  Diffuse large B-cell lymphoma of intra-abdominal lymph nodes (HCC) Diffuse large lymphoma status post CHOP chemotherapy 2010. Last PET scan September 2017 NED. Clinically patient does not have any evidence of recurrence. Patient will not need an imaging for the surveillance of history of diffuse B-cell lymphoma.   # CKD stage III- stable.  # Patient will continue to get age-appropriate screening with her PCP.   #Discussed with the patient regarding follow-ups- she is comfortable follow up with [place] only as needed; follow up with PCP.   Cc; [place]   No orders of the defined types were placed in this encounter.  All questions were answered. The patient knows to call the clinic with any problems, questions or concerns.      Cammie Sickle, MD 08/18/2017 1:27 PM

## 2017-08-18 NOTE — Assessment & Plan Note (Addendum)
Diffuse large lymphoma status post CHOP chemotherapy 2010. Last PET scan September 2017 NED. Clinically patient does not have any evidence of recurrence. Patient will not need an imaging for the surveillance of history of diffuse B-cell lymphoma.   # CKD stage III- stable.  # Patient will continue to get age-appropriate screening with her PCP.   #Discussed with the patient regarding follow-ups- she is comfortable follow up with [place] only as needed; follow up with PCP.   Cc; Rebecca Stokes

## 2017-11-20 ENCOUNTER — Ambulatory Visit
Admission: EM | Admit: 2017-11-20 | Discharge: 2017-11-20 | Disposition: A | Discharge status: Home / Self Care | Payer: Medicare Other | Attending: Family Medicine | Admitting: Family Medicine

## 2017-11-20 ENCOUNTER — Encounter: Payer: Self-pay | Admitting: Gynecology

## 2017-11-20 ENCOUNTER — Other Ambulatory Visit: Payer: Self-pay

## 2017-11-20 DIAGNOSIS — R42 Dizziness and giddiness: Secondary | ICD-10-CM | POA: Diagnosis not present

## 2017-11-20 DIAGNOSIS — H66012 Acute suppurative otitis media with spontaneous rupture of ear drum, left ear: Secondary | ICD-10-CM

## 2017-11-20 MED ORDER — AMOXICILLIN-POT CLAVULANATE 875-125 MG PO TABS: 1.0000 | ORAL_TABLET | Freq: Two times a day (BID) | ORAL | 0 refills | Status: DC

## 2017-11-20 NOTE — ED Provider Notes (Signed)
[facility]    CSN: [id number] Arrival date & time: 11/20/17  2505     History   Chief Complaint Chief Complaint  Patient presents with  . Otalgia  . Dizziness    HPI LEYDI WINSTEAD is a 84 y.o. female presents to the urgent care facility for evaluation of left ear pain, pressure and decreased hearing.  Patient states 3 days ago she developed some fullness and pressure behind the left ear.  Yesterday she noticed some drainage that was slightly green.  Today she noticed some red and green drainage.  She is having mild pain and discomfort to the left ear with slight decrease in hearing.  This morning upon awakening when she stood up she felt dizzy but denies any chest pain, shortness of breath, vision changes, headache.  Dizziness only lasted less than 1 minute and she now denies any dizziness with standing or with head range of motion.  No ringing in the years.  HPI  Past Medical History:  Diagnosis Date  . Cancer ([place])    non hodgkins lymphoma  . Hernia   . Hypertension   . Kidney stone   . Thyroid disease     Patient Active Problem List   Diagnosis Date Noted  . Anxiety 07/31/2015  . Colon polyp 07/31/2015  . Compression fracture 07/31/2015  . Diverticulitis of colon 07/31/2015  . DD (diverticular disease) 07/31/2015  . Cephalalgia 07/31/2015  . H/O disease 07/31/2015  . Adult hypothyroidism 07/31/2015  . Hypercholesterolemia without hypertriglyceridemia 07/31/2015  . Acute upper respiratory infection 11/07/2014  . Arteriosclerosis of coronary artery 08/23/2014  . BP (high blood pressure) 05/23/2014  . History of non-Hodgkin's lymphoma 10/24/2013  . Abnormal findings on diagnostic imaging of heart and coronary circulation 02/06/2013  . Acute cystitis 02/02/2013  . Abdominal pain, right lower quadrant 12/23/2012  . Calculus of kidney 12/23/2012  . Diffuse large B-cell lymphoma of intra-abdominal lymph nodes ([place]) 12/23/2012  . Neuralgia neuritis,  sciatic nerve 12/23/2012  . Urge incontinence 12/23/2012    Past Surgical History:  Procedure Laterality Date  . ABDOMINAL HYSTERECTOMY  1938  . CHOLECYSTECTOMY  1991  . COLONOSCOPY  2010  . INSERTION CENTRAL VENOUS ACCESS DEVICE W/ SUBCUTANEOUS PORT  2010   Dr Jamal Collin  . TONSILLECTOMY    . UPPER GI ENDOSCOPY  1981    OB History    No data available       Home Medications    Prior to Admission medications   Medication Sig Start Date End Date Taking? Authorizing Provider  aspirin EC 81 MG tablet Take 81 mg by mouth. 12/23/12  Yes [provider]  Biotin 10 MG TABS Take by mouth daily.   Yes [provider]  Calcium-Phosphorus-Vitamin D (CITRACAL +D3 PO) Take by mouth.   Yes [provider]  Cyanocobalamin (VITAMIN B 12 PO) Take by mouth daily.   Yes [provider]  GNP GARLIC EXTRACT PO Take 3,976 mg by mouth.   Yes [provider]  hydrochlorothiazide (HYDRODIURIL) 25 MG tablet Take 25 mg by mouth daily.  09/25/13  Yes [provider]  levothyroxine (SYNTHROID, LEVOTHROID) 100 MCG tablet Take 100 mcg by mouth daily before breakfast.  10/16/13  Yes [provider]  Multiple Vitamins-Minerals (WOMENS 50+ MULTI VITAMIN/MIN PO) Take by mouth daily.   Yes [provider]  omega-3 acid ethyl esters (LOVAZA) 1 G capsule Take 1 g by mouth daily.   Yes [provider]  pantoprazole (PROTONIX)  40 MG tablet  07/08/16  Yes [provider]  pyridoxine (B-6) 100 MG tablet Take 100 mg by mouth daily.   Yes [provider]  amoxicillin-clavulanate (AUGMENTIN) 875-125 MG tablet Take 1 tablet by mouth every 12 (twelve) hours. 7 days 11/20/17   Duanne Guess, PA-C  BENICAR 20 MG tablet Take 20 mg by mouth daily.  09/25/13   [provider]  co-enzyme Q-10 30 MG capsule Take 30 mg by mouth daily.    [provider]  ranitidine (ZANTAC) 150 MG capsule Take 150 mg by mouth 2 (two)  times daily.    [provider]    Family History Family History  Problem Relation Age of Onset  . Heart failure Mother     Social History Social History   Tobacco Use  . Smoking status: Former Research scientist (life sciences)  . Smokeless tobacco: Never Used  Substance Use Topics  . Alcohol use: No  . Drug use: No     Allergies   Fish allergy; Ciprofloxacin; Codeine; Ephedrine; Phenergan [promethazine hcl]; Shellfish allergy; [place] [sulfur]; Other; and Strawberry extract   Review of Systems Review of Systems  Constitutional: Negative for fever.  HENT: Positive for ear discharge, ear pain and hearing loss (.  Mild).   Respiratory: Negative for shortness of breath.   Cardiovascular: Negative for chest pain.  Gastrointestinal: Negative for abdominal pain.  Genitourinary: Negative for difficulty urinating, dysuria and urgency.  Musculoskeletal: Negative for back pain and myalgias.  Skin: Negative for rash.  Neurological: Positive for dizziness (.  This morning less than 1 minute, has resolved.). Negative for headaches.     Physical Exam Triage Vital Signs ED Triage Vitals  Enc Vitals Group     BP 11/20/17 1048 (!) 143/63     Pulse Rate 11/20/17 1048 60     Resp 11/20/17 1048 16     Temp 11/20/17 1048 97.8 F (36.6 C)     Temp Source 11/20/17 1048 Oral     SpO2 11/20/17 1048 100 %     Weight 11/20/17 1043 148 lb (67.1 kg)     Height 11/20/17 1043 5\' 1"  (1.549 m)     Head Circumference --      Peak Flow --      Pain Score 11/20/17 1044 6     Pain Loc --      Pain Edu? --      Excl. in [place]? --    No data found.  Updated Vital Signs BP (!) 143/63 (BP Location: Left Arm)   Pulse 60   Temp 97.8 F (36.6 C) (Oral)   Resp 16   Ht 5\' 1"  (1.549 m)   Wt 148 lb (67.1 kg)   SpO2 100%   BMI 27.96 kg/m   Visual Acuity Right Eye Distance:   Left Eye Distance:   Bilateral Distance:    Right Eye Near:   Left Eye Near:    Bilateral Near:     Physical Exam  Constitutional:  She is oriented to person, place, and time. She appears well-developed and well-nourished.  HENT:  Head: Normocephalic and atraumatic.  Right Ear: External ear normal.  Left Ear: External ear normal.  Nose: Nose normal.  Mouth/Throat: Oropharynx is clear and moist.  Examination of the left TM shows green mucus behind the TM with very small tear along the 11 o'clock position with very minimal drainage in the canal.  No sign of canal infection.  No sign of foreign body.  Eyes: Conjunctivae are normal.  Neck: Normal range of motion.  Cardiovascular: Normal rate.  Pulmonary/Chest: Effort normal. No respiratory distress.  Musculoskeletal: Normal range of motion.  Neurological: She is alert and oriented to person, place, and time. No cranial nerve deficit. Coordination normal.  No dizziness with standing, head rotation  Skin: Skin is warm. No rash noted.  Psychiatric: She has a normal mood and affect. Her behavior is normal. Thought content normal.     UC Treatments / Results  Labs (all labs ordered are listed, but only abnormal results are displayed) Labs Reviewed - No data to display  EKG  EKG Interpretation None       Radiology No results found.  Procedures Procedures (including critical care time)  Medications Ordered in UC Medications - No data to display   Initial Impression / Assessment and Plan / UC Course  I have reviewed the triage vital signs and the nursing notes.  Pertinent labs & imaging results that were available during my care of the patient were reviewed by me and considered in my medical decision making (see chart for details).     84 year old female with left ear otitis media with slight perforation of the TM.  She is placed on Augmentin.  She will keep canal clean and dry.  She will follow ENT.  She is educated on signs and symptoms return to the clinic for.  Final Clinical Impressions(s) / UC Diagnoses   Final diagnoses:  Acute suppurative otitis  media of left ear with spontaneous rupture of tympanic membrane, recurrence not specified    ED Discharge Orders        Ordered    amoxicillin-clavulanate (AUGMENTIN) 875-125 MG tablet  Every 12 hours     11/20/17 1103        Duanne Guess, [place] 11/20/17 1113

## 2017-11-20 NOTE — Discharge Instructions (Signed)
Please take antibiotics as prescribed.  Keep left ear canal dry when showering.  If any fevers increasing pain, return to the clinic.  Follow-up with ENT physician for recheck.

## 2017-11-20 NOTE — ED Triage Notes (Signed)
Per patient x 3 days left ear pain and this morning felt dizzy. Per patient after using a Q-tip to clean her ear she saw small amount of blood on Q-tip.

## 2017-11-23 ENCOUNTER — Telehealth: Payer: Self-pay | Admitting: Emergency Medicine

## 2017-11-23 MED ORDER — CEFDINIR 300 MG PO CAPS
300.0000 mg | ORAL_CAPSULE | Freq: Two times a day (BID) | ORAL | 0 refills | Status: DC
Start: 2017-11-23 — End: 2021-04-01

## 2017-11-23 NOTE — Telephone Encounter (Signed)
Called patient to follow-up on how she is doing since her recent visit at [facility].  Patient states that she thinks that she is allergic to the antibiotic.  Patient is currently taking Augmentin.  Patient states that she has been vomiting and feeling weak.  Patient was advise that I will see if we can switch her antibiotic but that she needed to follow-up with ENT as advised during her visit.  Patient verbalized understanding.

## 2017-11-23 NOTE — Telephone Encounter (Signed)
Dr. Lacinda Axon has sent over a prescription for [facility] for patient to her pharmacy.  Patient was notified to stop the Augmentin and start the [facility].  Patient verbalized understanding.

## 2017-11-23 NOTE — Telephone Encounter (Signed)
New Rx seen.

## 2017-12-16 ENCOUNTER — Other Ambulatory Visit: Payer: Self-pay | Admitting: Physician Assistant

## 2017-12-16 DIAGNOSIS — Z1231 Encounter for screening mammogram for malignant neoplasm of breast: Secondary | ICD-10-CM

## 2018-01-06 ENCOUNTER — Ambulatory Visit
Admission: RE | Admit: 2018-01-06 | Discharge: 2018-01-06 | Disposition: A | Discharge status: Home / Self Care | Payer: Medicare Other | Source: Ambulatory Visit | Attending: Physician Assistant | Admitting: Physician Assistant

## 2018-01-06 DIAGNOSIS — Z1231 Encounter for screening mammogram for malignant neoplasm of breast: Secondary | ICD-10-CM | POA: Diagnosis present

## 2019-08-08 ENCOUNTER — Other Ambulatory Visit: Payer: Self-pay | Admitting: Physician Assistant

## 2019-08-08 DIAGNOSIS — Z1231 Encounter for screening mammogram for malignant neoplasm of breast: Secondary | ICD-10-CM

## 2019-08-11 ENCOUNTER — Emergency Department
Admission: EM | Admit: 2019-08-11 | Discharge: 2019-08-12 | Disposition: A | Discharge status: Home / Self Care | Payer: Medicare Other | Attending: Emergency Medicine | Admitting: Emergency Medicine

## 2019-08-11 ENCOUNTER — Encounter: Payer: Self-pay | Admitting: Emergency Medicine

## 2019-08-11 ENCOUNTER — Other Ambulatory Visit: Payer: Self-pay

## 2019-08-11 ENCOUNTER — Emergency Department: Payer: Medicare Other

## 2019-08-11 DIAGNOSIS — I10 Essential (primary) hypertension: Secondary | ICD-10-CM | POA: Insufficient documentation

## 2019-08-11 DIAGNOSIS — M79622 Pain in left upper arm: Secondary | ICD-10-CM | POA: Insufficient documentation

## 2019-08-11 DIAGNOSIS — C8590 Non-Hodgkin lymphoma, unspecified, unspecified site: Secondary | ICD-10-CM | POA: Diagnosis not present

## 2019-08-11 DIAGNOSIS — E039 Hypothyroidism, unspecified: Secondary | ICD-10-CM | POA: Insufficient documentation

## 2019-08-11 DIAGNOSIS — Z7982 Long term (current) use of aspirin: Secondary | ICD-10-CM | POA: Diagnosis not present

## 2019-08-11 DIAGNOSIS — Z79899 Other long term (current) drug therapy: Secondary | ICD-10-CM | POA: Insufficient documentation

## 2019-08-11 DIAGNOSIS — M79602 Pain in left arm: Secondary | ICD-10-CM

## 2019-08-11 DIAGNOSIS — Z87891 Personal history of nicotine dependence: Secondary | ICD-10-CM | POA: Insufficient documentation

## 2019-08-11 LAB — CBC WITH DIFFERENTIAL/PLATELET
Abs Immature Granulocytes: 0.02 10*3/uL (ref 0.00–0.07)
Basophils Absolute: 0.1 10*3/uL (ref 0.0–0.1)
Basophils Relative: 1 %
Eosinophils Absolute: 0.2 10*3/uL (ref 0.0–0.5)
Eosinophils Relative: 3 %
HCT: 36.7 % (ref 36.0–46.0)
Hemoglobin: 12.3 g/dL (ref 12.0–15.0)
Immature Granulocytes: 0 %
Lymphocytes Relative: 48 %
Lymphs Abs: 3.5 10*3/uL (ref 0.7–4.0)
MCH: 31.2 pg (ref 26.0–34.0)
MCHC: 33.5 g/dL (ref 30.0–36.0)
MCV: 93.1 fL (ref 80.0–100.0)
Monocytes Absolute: 0.5 10*3/uL (ref 0.1–1.0)
Monocytes Relative: 7 %
Neutro Abs: 3 10*3/uL (ref 1.7–7.7)
Neutrophils Relative %: 41 %
Platelets: 222 10*3/uL (ref 150–400)
RBC: 3.94 MIL/uL (ref 3.87–5.11)
RDW: 14.3 % (ref 11.5–15.5)
WBC: 7.4 10*3/uL (ref 4.0–10.5)
nRBC: 0 % (ref 0.0–0.2)

## 2019-08-11 LAB — BASIC METABOLIC PANEL
Anion gap: 11 (ref 5–15)
BUN: 25 mg/dL — ABNORMAL HIGH (ref 8–23)
CO2: 27 mmol/L (ref 22–32)
Calcium: 9.2 mg/dL (ref 8.9–10.3)
Chloride: 102 mmol/L (ref 98–111)
Creatinine, Ser: 1.13 mg/dL — ABNORMAL HIGH (ref 0.44–1.00)
GFR calc Af Amer: 52 mL/min — ABNORMAL LOW (ref >60)
GFR calc non Af Amer: 45 mL/min — ABNORMAL LOW (ref >60)
Glucose, Bld: 134 mg/dL — ABNORMAL HIGH (ref 70–99)
Potassium: 4 mmol/L (ref 3.5–5.1)
Sodium: 140 mmol/L (ref 135–145)

## 2019-08-11 LAB — TROPONIN I (HIGH SENSITIVITY)
Troponin I (High Sensitivity): 7 ng/L (ref <18)
Troponin I (High Sensitivity): 7 ng/L (ref <18)

## 2019-08-11 NOTE — ED Triage Notes (Signed)
Pt presents to ER from home via [facility] with complaints of sudden left arm pain that radiated to left hand, reports left hand colder than right hand. Upon arrival to ER symptoms resolved, pt denies any chest pain, shortness of breath, denies any other symptom. Pt talks in complete sentences no distress noted

## 2019-08-11 NOTE — ED Provider Notes (Signed)
[facility] Emergency Department Provider Note   ____________________________________________   First MD Initiated Contact with Patient 08/11/19 2138     (approximate)  I have reviewed the triage vital signs and the nursing notes.   HISTORY  Chief Complaint Arm Pain (Left Arm )    HPI Rebecca Stokes is a 85 y.o. female with past medical history of hypertension and non-Hodgkin's lymphoma who presents to the ED complaining of arm pain.  Patient reports she had sudden onset of a cool feeling extending down her entire left arm about 1 hour prior to arrival.  She states she was sitting down playing computer games when this occurred and it lasted for about 30 minutes before resolving on its own.  It was not associated with any chest pain or shortness of breath, was not exacerbated or alleviated by anything.  She states she has never had similar symptoms in the past.  She states she is otherwise been feeling well with no recent fevers, cough, leg swelling or pain.  She denies any cardiac history.        Past Medical History:  Diagnosis Date  . Cancer ([place])    non hodgkins lymphoma  . Hernia   . Hypertension   . Kidney stone   . Thyroid disease     Patient Active Problem List   Diagnosis Date Noted  . Anxiety 07/31/2015  . Colon polyp 07/31/2015  . Compression fracture 07/31/2015  . Diverticulitis of colon 07/31/2015  . DD (diverticular disease) 07/31/2015  . Cephalalgia 07/31/2015  . H/O disease 07/31/2015  . Adult hypothyroidism 07/31/2015  . Hypercholesterolemia without hypertriglyceridemia 07/31/2015  . Acute upper respiratory infection 11/07/2014  . Arteriosclerosis of coronary artery 08/23/2014  . BP (high blood pressure) 05/23/2014  . History of non-Hodgkin's lymphoma 10/24/2013  . Abnormal findings on diagnostic imaging of heart and coronary circulation 02/06/2013  . Acute cystitis 02/02/2013  . Abdominal pain, right lower quadrant  12/23/2012  . Calculus of kidney 12/23/2012  . Diffuse large B-cell lymphoma of intra-abdominal lymph nodes ([place]) 12/23/2012  . Neuralgia neuritis, sciatic nerve 12/23/2012  . Urge incontinence 12/23/2012    Past Surgical History:  Procedure Laterality Date  . ABDOMINAL HYSTERECTOMY  1938  . CHOLECYSTECTOMY  1991  . COLONOSCOPY  2010  . INSERTION CENTRAL VENOUS ACCESS DEVICE W/ SUBCUTANEOUS PORT  2010   Dr Jamal Collin  . TONSILLECTOMY    . UPPER GI ENDOSCOPY  1981    Prior to Admission medications   Medication Sig Start Date End Date Taking? Authorizing Provider  amoxicillin-clavulanate (AUGMENTIN) 875-125 MG tablet Take 1 tablet by mouth every 12 (twelve) hours. 7 days 11/20/17   Duanne Guess, PA-C  aspirin EC 81 MG tablet Take 81 mg by mouth. 12/23/12   [provider]  BENICAR 20 MG tablet Take 20 mg by mouth daily.  09/25/13   [provider]  Biotin 10 MG TABS Take by mouth daily.    [provider]  Calcium-Phosphorus-Vitamin D (CITRACAL +D3 PO) Take by mouth.    [provider]  cefdinir (OMNICEF) 300 MG capsule Take 1 capsule (300 mg total) by mouth 2 (two) times daily. 11/23/17   Coral Spikes, DO  co-enzyme Q-10 30 MG capsule Take 30 mg by mouth daily.    [provider]  Cyanocobalamin (VITAMIN B 12 PO) Take by mouth daily.    [provider]  GNP GARLIC EXTRACT PO Take [id number] mg by mouth.  [provider]  hydrochlorothiazide (HYDRODIURIL) 25 MG tablet Take 25 mg by mouth daily.  09/25/13   [provider]  levothyroxine (SYNTHROID, LEVOTHROID) 100 MCG tablet Take 100 mcg by mouth daily before breakfast.  10/16/13   [provider]  Multiple Vitamins-Minerals (WOMENS 50+ MULTI VITAMIN/MIN PO) Take by mouth daily.    [provider]  omega-3 acid ethyl esters (LOVAZA) 1 G capsule Take 1 g by mouth daily.    [provider]  pantoprazole (PROTONIX) 40 MG tablet  07/08/16    [provider]  pyridoxine (B-6) 100 MG tablet Take 100 mg by mouth daily.    [provider]  ranitidine (ZANTAC) 150 MG capsule Take 150 mg by mouth 2 (two) times daily.    [provider]    Allergies Fish allergy, Ciprofloxacin, Codeine, Ephedrine, Phenergan [promethazine hcl], Shellfish allergy, Sulphur [sulfur], Other, and Strawberry extract  Family History  Problem Relation Age of Onset  . Heart failure Mother   . Breast cancer Neg Hx     Social History Social History   Tobacco Use  . Smoking status: Former Research scientist (life sciences)  . Smokeless tobacco: Never Used  Substance Use Topics  . Alcohol use: No  . Drug use: No    Review of Systems  Constitutional: No fever/chills Eyes: No visual changes. ENT: No sore throat. Cardiovascular: Denies chest pain. Respiratory: Denies shortness of breath. Gastrointestinal: No abdominal pain.  No nausea, no vomiting.  No diarrhea.  No constipation. Genitourinary: Negative for dysuria. Musculoskeletal: Negative for back pain.  Positive for left arm pain. Skin: Negative for rash. Neurological: Negative for headaches, focal weakness or numbness.  ____________________________________________   PHYSICAL EXAM:  VITAL SIGNS: ED Triage Vitals  Enc Vitals Group     BP      Pulse      Resp      Temp      Temp src      SpO2      Weight      Height      Head Circumference      Peak Flow      Pain Score      Pain Loc      Pain Edu?      Excl. in [place]?     Constitutional: Alert and oriented. Eyes: Conjunctivae are normal. Head: Atraumatic. Nose: No congestion/rhinnorhea. Mouth/Throat: Mucous membranes are moist. Neck: Normal ROM Cardiovascular: Normal rate, regular rhythm. Grossly normal heart sounds.  2+ radial pulses bilaterally. Respiratory: Normal respiratory effort.  No retractions. Lungs CTAB. Gastrointestinal: Soft and nontender. No distention. Genitourinary: deferred Musculoskeletal: No lower  extremity tenderness nor edema.  Range of motion intact to bilateral upper extremities without pain.  Bilateral upper extremities are warm and well-perfused with no edema. Neurologic:  Normal speech and language. No gross focal neurologic deficits are appreciated. Skin:  Skin is warm, dry and intact. No rash noted. Psychiatric: Mood and affect are normal. Speech and behavior are normal.  ____________________________________________   LABS (all labs ordered are listed, but only abnormal results are displayed)  Labs Reviewed  BASIC METABOLIC PANEL - Abnormal; Notable for the following components:      Result Value   Glucose, Bld 134 (*)    BUN 25 (*)    Creatinine, Ser 1.13 (*)    GFR calc non Af Amer 45 (*)    GFR calc Af Amer 52 (*)    All other components within normal limits  CBC WITH DIFFERENTIAL/PLATELET  TROPONIN I (HIGH SENSITIVITY)  TROPONIN I (HIGH SENSITIVITY)   ____________________________________________  EKG  ED ECG REPORT I, Blake Divine, the attending physician, personally viewed and interpreted this ECG.   Date: 08/11/2019  EKG Time: 21:47  Rate: 63  Rhythm: normal sinus rhythm  Axis: Normal  Intervals:none  ST&T Change: None    PROCEDURES  Procedure(s) performed (including Critical Care):  Procedures   ____________________________________________   INITIAL IMPRESSION / ASSESSMENT AND PLAN / ED COURSE       85 year old female presents to the ED complaining of sudden onset of cool feeling and discomfort to her left arm with no associated chest pain or shortness of breath.  Her left upper extremity is warm and well-perfused with strength and sensation intact.  Will need to assess for cardiac etiology of patient's symptoms, screen EKG and 2 sets of troponin.  Also check chest x-ray, basic labs.  EKG without acute ischemic changes, initial troponin negative.  Remainder of labs unremarkable and chest x-ray negative for acute process.  Low suspicion  for ACS at this time, if repeat troponin within normal limits, patient would be appropriate for discharge home with PCP follow-up.  Patient turned over to Dr. Owens Shark pending repeat troponin.      ____________________________________________   FINAL CLINICAL IMPRESSION(S) / ED DIAGNOSES  Final diagnoses:  Pain of left upper extremity     ED Discharge Orders    None       Note:  This document was prepared using Dragon voice recognition software and may include unintentional dictation errors.   Blake Divine, MD 08/12/19 [phone]

## 2019-08-12 NOTE — ED Notes (Signed)
Dr. Brown at bedside

## 2019-09-12 ENCOUNTER — Ambulatory Visit
Admission: RE | Admit: 2019-09-12 | Discharge: 2019-09-12 | Disposition: A | Discharge status: Home / Self Care | Payer: Medicare Other | Source: Ambulatory Visit | Attending: Physician Assistant | Admitting: Physician Assistant

## 2019-09-12 DIAGNOSIS — Z1231 Encounter for screening mammogram for malignant neoplasm of breast: Secondary | ICD-10-CM | POA: Insufficient documentation

## 2021-04-01 ENCOUNTER — Encounter: Payer: Self-pay | Admitting: Oncology

## 2021-04-01 ENCOUNTER — Inpatient Hospital Stay: Payer: Medicare Other

## 2021-04-01 ENCOUNTER — Inpatient Hospital Stay: Payer: Medicare Other | Attending: Oncology | Admitting: Oncology

## 2021-04-01 VITALS — BP 135/61 | HR 61 | Temp 97.1°F | Resp 18 | Wt 129.6 lb

## 2021-04-01 DIAGNOSIS — R634 Abnormal weight loss: Secondary | ICD-10-CM | POA: Insufficient documentation

## 2021-04-01 DIAGNOSIS — Z9221 Personal history of antineoplastic chemotherapy: Secondary | ICD-10-CM | POA: Insufficient documentation

## 2021-04-01 DIAGNOSIS — C8333 Diffuse large B-cell lymphoma, intra-abdominal lymph nodes: Secondary | ICD-10-CM

## 2021-04-01 DIAGNOSIS — Z8572 Personal history of non-Hodgkin lymphomas: Secondary | ICD-10-CM | POA: Insufficient documentation

## 2021-04-01 DIAGNOSIS — R413 Other amnesia: Secondary | ICD-10-CM | POA: Diagnosis not present

## 2021-04-01 LAB — CBC WITH DIFFERENTIAL/PLATELET
Abs Immature Granulocytes: 0.01 10*3/uL (ref 0.00–0.07)
Basophils Absolute: 0.1 10*3/uL (ref 0.0–0.1)
Basophils Relative: 1 %
Eosinophils Absolute: 0.2 10*3/uL (ref 0.0–0.5)
Eosinophils Relative: 3 %
HCT: 37.7 % (ref 36.0–46.0)
Hemoglobin: 12.5 g/dL (ref 12.0–15.0)
Immature Granulocytes: 0 %
Lymphocytes Relative: 30 %
Lymphs Abs: 2.3 10*3/uL (ref 0.7–4.0)
MCH: 31.1 pg (ref 26.0–34.0)
MCHC: 33.2 g/dL (ref 30.0–36.0)
MCV: 93.8 fL (ref 80.0–100.0)
Monocytes Absolute: 0.5 10*3/uL (ref 0.1–1.0)
Monocytes Relative: 6 %
Neutro Abs: 4.4 10*3/uL (ref 1.7–7.7)
Neutrophils Relative %: 60 %
Platelets: 201 10*3/uL (ref 150–400)
RBC: 4.02 MIL/uL (ref 3.87–5.11)
RDW: 13.7 % (ref 11.5–15.5)
WBC: 7.5 10*3/uL (ref 4.0–10.5)
nRBC: 0 % (ref 0.0–0.2)

## 2021-04-01 LAB — COMPREHENSIVE METABOLIC PANEL
ALT: 12 U/L (ref 0–44)
AST: 28 U/L (ref 15–41)
Albumin: 4.1 g/dL (ref 3.5–5.0)
Alkaline Phosphatase: 74 U/L (ref 38–126)
Anion gap: 11 (ref 5–15)
BUN: 21 mg/dL (ref 8–23)
CO2: 28 mmol/L (ref 22–32)
Calcium: 9.5 mg/dL (ref 8.9–10.3)
Chloride: 103 mmol/L (ref 98–111)
Creatinine, Ser: 1.08 mg/dL — ABNORMAL HIGH (ref 0.44–1.00)
GFR, Estimated: 50 mL/min — ABNORMAL LOW (ref >60)
Glucose, Bld: 95 mg/dL (ref 70–99)
Potassium: 4 mmol/L (ref 3.5–5.1)
Sodium: 142 mmol/L (ref 135–145)
Total Bilirubin: 0.9 mg/dL (ref 0.3–1.2)
Total Protein: 7.1 g/dL (ref 6.5–8.1)

## 2021-04-01 LAB — TECHNOLOGIST SMEAR REVIEW: Plt Morphology: ADEQUATE

## 2021-04-01 LAB — LACTATE DEHYDROGENASE: LDH: 165 U/L (ref 98–192)

## 2021-04-01 LAB — URIC ACID: Uric Acid, Serum: 4.3 mg/dL (ref 2.5–7.1)

## 2021-04-01 NOTE — Progress Notes (Signed)
Hematology/Oncology Consult note [facility] [phone] [phone]   Patient Care Team: Marinda Elk, MD as PCP - General (Physician Assistant)  REFERRING PROVIDER: Marinda Elk, MD  CHIEF COMPLAINTS/REASON FOR VISIT:  Evaluation of diffuse large B-cell lymphoma  HISTORY OF PRESENTING ILLNESS:   Rebecca Stokes is a  87 y.o.  female with PMH listed below was seen in consultation at the request of  Marinda Elk, MD  for evaluation of diffuse large B-cell lymphoma  Patient has a history of stage III diffuse large B-cell lymphoma treated in 2010 by Dr. Jeb Levering.  She was status post 7 cycles of R-CHOP.  Patient followed up with Dr. Rogue Bussing and was last seen by him 4 years ago.  Patient and her daughter noticed that patient has unintentional weight loss, about 6 pounds for the past few months and 20 to 30 pounds over the past 12 to 18 months.  Patient does not have good oral intake.  No nausea vomiting diarrhea.  Denies any night sweats, lumps or bumps.  She is fairly active for her age.. Patient has had memory loss, questionable dementia.  PCP feels that patient most likely will benefit from neurology evaluation and patient declined.  Husband is in a skilled nursing facility.  She is able to care for her own ADLs. Patient lives at home by herself.  Her daughter accompanied her to today's visit. Review of Systems  Constitutional: Positive for unexpected weight change. Negative for appetite change, chills, fatigue and fever.  HENT:   Negative for hearing loss and voice change.   Eyes: Negative for eye problems.  Respiratory: Negative for chest tightness and cough.   Cardiovascular: Negative for chest pain.  Gastrointestinal: Negative for abdominal distention, abdominal pain and blood in stool.  Endocrine: Negative for hot flashes.  Genitourinary: Negative for difficulty urinating and frequency.   Musculoskeletal: Negative  for arthralgias.  Skin: Negative for itching and rash.  Neurological: Negative for extremity weakness.  Hematological: Negative for adenopathy.  Psychiatric/Behavioral: Negative for confusion.       Memory loss    MEDICAL HISTORY:  Past Medical History:  Diagnosis Date  . Cancer ([place])    non hodgkins lymphoma  . Hernia   . Hypertension   . Kidney stone   . Thyroid disease     SURGICAL HISTORY: Past Surgical History:  Procedure Laterality Date  . ABDOMINAL HYSTERECTOMY  1938  . CHOLECYSTECTOMY  1991  . COLONOSCOPY  2010  . INSERTION CENTRAL VENOUS ACCESS DEVICE W/ SUBCUTANEOUS PORT  2010   Dr Jamal Collin  . TONSILLECTOMY    . UPPER GI ENDOSCOPY  1981    SOCIAL HISTORY: Social History   Socioeconomic History  . Marital status: Widowed    Spouse name: Not on file  . Number of children: Not on file  . Years of education: Not on file  . Highest education level: Not on file  Occupational History  . Not on file  Tobacco Use  . Smoking status: Never Smoker  . Smokeless tobacco: Never Used  Vaping Use  . Vaping Use: Never used  Substance and Sexual Activity  . Alcohol use: No  . Drug use: No  . Sexual activity: Not on file  Other Topics Concern  . Not on file  Social History Narrative  . Not on file   Social Determinants of Health   Financial Resource Strain: Not on file  Food Insecurity: Not on file  Transportation Needs: Not  on file  Physical Activity: Not on file  Stress: Not on file  Social Connections: Not on file  Intimate Partner Violence: Not on file    FAMILY HISTORY: Family History  Problem Relation Age of Onset  . Heart failure Mother   . Dementia Father   . Breast cancer Neg Hx     ALLERGIES:  is allergic to fish allergy, ciprofloxacin, codeine, ephedrine, phenergan [promethazine hcl], shellfish allergy, sulphur [elemental sulfur], other, and strawberry extract.  MEDICATIONS:  Current Outpatient Medications  Medication Sig Dispense Refill   . donepezil (ARICEPT) 5 MG tablet Take 5 mg by mouth daily.    Marland Kitchen levothyroxine (SYNTHROID, LEVOTHROID) 100 MCG tablet Take 100 mcg by mouth daily before breakfast.     . losartan (COZAAR) 50 MG tablet     . mirtazapine (REMERON) 7.5 MG tablet Take 7.5 mg by mouth at bedtime.    . Multiple Vitamins-Minerals (WOMENS 50+ MULTI VITAMIN/MIN PO) Take by mouth daily.     No current facility-administered medications for this visit.     PHYSICAL EXAMINATION: ECOG PERFORMANCE STATUS: 1 - Symptomatic but completely ambulatory Vitals:   04/01/21 1105  BP: 135/61  Pulse: 61  Resp: 18  Temp: (!) 97.1 F (36.2 C)   Filed Weights   04/01/21 1105  Weight: 129 lb 9.6 oz (58.8 kg)    Physical Exam Constitutional:      General: She is not in acute distress.    Comments: Frail appearance, she ambulates independently  HENT:     Head: Normocephalic and atraumatic.  Eyes:     General: No scleral icterus. Cardiovascular:     Rate and Rhythm: Normal rate and regular rhythm.     Heart sounds: Normal heart sounds.  Pulmonary:     Effort: Pulmonary effort is normal. No respiratory distress.     Breath sounds: No wheezing.  Abdominal:     General: Bowel sounds are normal. There is no distension.     Palpations: Abdomen is soft.  Musculoskeletal:        General: No deformity. Normal range of motion.     Cervical back: Normal range of motion and neck supple.  Skin:    General: Skin is warm and dry.     Findings: No erythema or rash.  Neurological:     Mental Status: She is alert. Mental status is at baseline.     Cranial Nerves: No cranial nerve deficit.     Coordination: Coordination normal.  Psychiatric:        Mood and Affect: Mood normal.     LABORATORY DATA:  I have reviewed the data as listed Lab Results  Component Value Date   WBC 7.5 04/01/2021   HGB 12.5 04/01/2021   HCT 37.7 04/01/2021   MCV 93.8 04/01/2021   PLT 201 04/01/2021   Recent Labs    04/01/21 1143  NA 142   K 4.0  CL 103  CO2 28  GLUCOSE 95  BUN 21  CREATININE 1.08*  CALCIUM 9.5  GFRNONAA 50*  PROT 7.1  ALBUMIN 4.1  AST 28  ALT 12  ALKPHOS 74  BILITOT 0.9   Iron/TIBC/Ferritin/ %Sat No results found for: IRON, TIBC, FERRITIN, IRONPCTSAT    RADIOGRAPHIC STUDIES: I have personally reviewed the radiological images as listed and agreed with the findings in the report. No results found.    ASSESSMENT & PLAN:  1. Diffuse large B-cell lymphoma of intra-abdominal lymph nodes ([place])   2. Unintentional weight loss  History of stage III diffuse large B-cell lymphoma, status post 7 cycles of R-CHOP. Last PET scan was done on 08/12/2016 no evidence of recurrent lymphoma. Now she has unintentional weight loss.  Which may be secondary to poor oral intake. Check CBC, CMP, smear, LDH, flow cytometry. Check CT chest abdomen pelvis with contrast for work-up of weight loss.  Memory loss, possible dementia.  Continue follow-up with primary care provider.  She may benefit from seeing neurology Orders Placed This Encounter  Procedures  . CT CHEST ABDOMEN PELVIS W CONTRAST    Standing Status:   Future    Standing Expiration Date:   04/01/2022    Order Specific Question:   If indicated for the ordered procedure, I authorize the administration of contrast media per Radiology protocol    Answer:   Yes    Order Specific Question:   Preferred imaging location?    Answer:   [facility]    Order Specific Question:   Is Oral Contrast requested for this exam?    Answer:   Yes, Per Radiology protocol    Order Specific Question:   Reason for Exam (SYMPTOM  OR DIAGNOSIS REQUIRED)    Answer:   history of lymphoma, unintentional weight loss  . CBC with Differential/Platelet    Standing Status:   Future    Number of Occurrences:   1    Standing Expiration Date:   04/01/2022  . Comprehensive metabolic panel    Standing Status:   Future    Number of Occurrences:   1    Standing Expiration Date:    04/01/2022  . Flow cytometry panel-leukemia/lymphoma work-up    Standing Status:   Future    Number of Occurrences:   1    Standing Expiration Date:   04/01/2022  . Technologist smear review    Standing Status:   Future    Number of Occurrences:   1    Standing Expiration Date:   04/01/2022  . Lactate dehydrogenase    Standing Status:   Future    Number of Occurrences:   1    Standing Expiration Date:   04/01/2022  . Uric acid    Standing Status:   Future    Number of Occurrences:   1    Standing Expiration Date:   04/01/2022    All questions were answered. The patient knows to call the clinic with any problems questions or concerns.  CC Marinda Elk, MD    Return of visit: To be determined Thank you for this kind referral and the opportunity to participate in the care of this patient. A copy of today's note is routed to referring provider    Earlie Server, MD, PhD Hematology Oncology [facility] Pager- [phone] 04/01/2021

## 2021-04-01 NOTE — Progress Notes (Signed)
Pt here to establish care.

## 2021-04-03 LAB — COMP PANEL: LEUKEMIA/LYMPHOMA

## 2021-04-04 ENCOUNTER — Telehealth: Payer: Self-pay | Admitting: *Deleted

## 2021-04-04 NOTE — Telephone Encounter (Signed)
Blood work not remarkable. Flowcytometry is negative. Awaiting CT. Please schedule her to see me a few days after CT thanks.

## 2021-04-04 NOTE — Telephone Encounter (Signed)
Daughter Lesleigh Noe called asking for results of tests from Tuesday. Patient does not have a follow up appointment at this time, but has her CT scan scheduled for 04/15/21. Please return her call.  CBC with Differential/Platelet Order: [id number]  Status: Final result   Visible to patient: Yes (not seen)   Next appt: 04/15/2021 at 01:00 PM in Radiology (MCM-CT)   Dx: Diffuse large B-cell lymphoma of intr...   0 Result Notes  Component Ref Range & Units 3 d ago  (04/01/21) 1 yr ago  (08/11/19) 3 yr ago  (08/18/17) 4 yr ago  (08/05/16) 5 yr ago  (07/31/15) 5 yr ago  (06/11/15) 6 yr ago  (07/27/14)  WBC 4.0 - 10.5 K/uL 7.5  7.4  5.8 R  7.8 R  9.8 R  6.1 R  6.1 R   RBC 3.87 - 5.11 MIL/uL 4.02  3.94  3.93 R  3.97 R  4.25 R  4.28 R  4.15 R   Hemoglobin 12.0 - 15.0 g/dL 12.5  12.3  12.5 R  12.6 R  13.1 R  13.3 R    HCT 36.0 - 46.0 % 37.7  36.7  36.8 R  36.7 R  39.7 R  39.9 R    MCV 80.0 - 100.0 fL 93.8  93.1  93.7  92.4  93.4  93.2  96 R   MCH 26.0 - 34.0 pg 31.1  31.2  31.7  31.7  30.8  31.0  31.8   MCHC 30.0 - 36.0 g/dL 33.2  33.5  33.8 R  34.4 R  33.0 R  33.3 R  33.2 R   RDW 11.5 - 15.5 % 13.7  14.3  14.0 R  14.0 R  13.8 R  14.2 R  14.3 R   Platelets 150 - 400 K/uL 201  222  224 R  220 R  237 R  205 R  220 R   nRBC 0.0 - 0.2 % 0.0  0.0        Neutrophils Relative % % 60  41  49  54  55   49.1   Neutro Abs 1.7 - 7.7 K/uL 4.4  3.0  2.9 R  4.2 R  5.4 R   3.0 R   Lymphocytes Relative % 30  48  37  35  36   37.0   Lymphs Abs 0.7 - 4.0 K/uL 2.3  3.5  2.1 R  2.7 R  3.5 R   2.2 R   Monocytes Relative % _0 7.5   Monocytes Absolute 0.1 - 1.0 K/uL 0.5  0.5  0.4 R  0.5 R  0.6 R   0.5 R   Eosinophils Relative % _1 3.9   Eosinophils Absolute 0.0 - 0.5 K/uL 0.2  0.2  0.3 R  0.3 R  0.1 R   0.2 R   Basophils Relative % _2 2.5   Basophils Absolute 0.0 - 0.1 K/uL 0.1  0.1  0.1 R  0.1 R  0.1 R   0.2High R   Immature Granulocytes % 0  0        Abs Immature Granulocytes  0.00 - 0.07 K/uL 0.01  0.02 CM        Comment: Performed at [facility], [address] [place],  [address]  HGB  13.2 R   HCT        39.8 R   Resulting Agency  _0  [place] [facility] CON[facility]     Other Results from 04/01/2021   Technologist smear review Order: [id number]  Status: Final result   Visible to patient: Yes (not seen)   Next appt: 04/15/2021 at 01:00 PM in Radiology (MCM-CT)   Dx: Diffuse large B-cell lymphoma of intr...   0 Result Notes  Component 3 d ago  WBC MORPHOLOGY UNREMARKABLE   RBC MORPHOLOGY UNREMARKABLE   Tech Review PLATELETS APPEAR ADEQUATE   Comment: Normal platelet morphology  Performed at [facility], [address] [place], [place] [address]   Resulting Agency [facility]      Uric acid Order: [id number]  Status: Final result    Visible to patient: Yes (not seen)    Next appt: 04/15/2021 at 01:00 PM in Radiology (MCM-CT)    Dx: Diffuse large B-cell lymphoma of intr...    0 Result Notes    Ref Range & Units 3 d ago  Uric Acid, Serum 2.5 - 7.1 mg/dL 4.3   Comment: Performed at [facility], [address] [place], [place] [address]  Resulting Agency  [facility] CLIN LAB     Lactate dehydrogenase Order: [id number]  Status: Final result    Visible to patient: Yes (not seen)    Next appt: 04/15/2021 at 01:00 PM in Radiology (MCM-CT)    Dx: Diffuse large B-cell lymphoma of intr...    0 Result Notes   Component Ref Range & Units 3 d ago  (04/01/21) 3 yr ago  (08/18/17) 5 yr ago  (07/31/15) 6 yr ago  (07/27/14) 7 yr ago  (01/26/14) 8 yr ago  (03/03/13) 8 yr ago  (07/08/12)  LDH 98 - 192 U/L 165  141  131  172 R  154 R  202 R  162 R   Comment: Performed at [facility], [address] [place], [place]  [address]  Resulting Agency  [place] [facility] [place] [facility] [place] [facility]  [facility]       Flow cytometry panel-leukemia/lymphoma work-up Order: [id number]  Status: Edited Result - FINAL    Visible to patient: Yes (not seen)    Next appt: 04/15/2021 at 01:00 PM in Radiology (MCM-CT)    Dx: Diffuse large B-cell lymphoma of intr...    0 Result Notes   Component 3 d ago  PATH INTERP XXX-IMP Comment   Comment: No significant immunophenotypic abnormality detected  CLINICAL INFO Comment VC   Comment: (NOTE)  A recent CBC was not available for review at the time this report was  prepared.   Specimen Type Comment   Comment: Peripheral blood  ASSESSMENT OF LEUKOCYTES Comment   Comment: (NOTE)  No monoclonal B cell population is detected. kappa:lambda ratio 2.0  There is no loss of, or aberrant expression of, the pan T cell  antigens to  suggest a neoplastic T cell process.  A decreased CD4/T helper to CD8/T suppressor cell ratio is detected.  CD4:CD8 ratio 0.3  No circulating blasts are detected.  There is no immunophenotypic evidence of abnormal myeloid maturation.  Analysis of the leukocyte population shows: granulocytes 67%,  monocytes 4%,  lymphocytes 29%, blasts <0.1%, B cells 4%, T cells 23%, NK cells 2%.   %  Viable Cells Comment VC   Comment: 95%  ANALYSIS AND GATING STRATEGY Comment   Comment: 8 color analysis with CD45/SSC  IMMUNOPHENOTYPING STUDY Comment   Comment: (NOTE)  CD2    Normal     CD3    Normal  CD4    Normal     CD5    Normal  CD7    Normal     CD8    Normal  CD10   Normal     CD11b   Normal  CD13   Normal     CD14   Normal  CD16   Normal     CD19   Normal  CD20   Normal     CD33   Normal  CD34   Normal     CD38   Normal  CD45   Normal     CD56   Normal  CD57   Normal     CD117   Normal  HLA-DR  Normal     KAPPA   Normal  LAMBDA  Normal     CD64   Normal   PATHOLOGIST NAME Comment   Comment:  Smiley Houseman, M.D.  COMMENT: Comment VC   Comment: (NOTE)  Each antibody in this assay was utilized to assess for potential  abnormalities of studied cell populations or to characterize  identified abnormalities.  This test was developed and its performance characteristics  determined by [facility]. It has not been cleared or approved by the  U.S. Food and Drug Administration.  The FDA has determined that such clearance or approval is not  necessary. This test is used for clinical purposes. It should not  be regarded as investigational or for research.  Performed At: -[facility]  [address] [place], [place] [id number]  Katina Degree MDPhD [id number]  Performed At: [place], [place] [id number]  Katina Degree MDPhD [id number]   Resulting Agency [facility] CLIN LAB       Contains abnormal dataComprehensive metabolic panel Order: [id number]  Status: Final result    Visible to patient: Yes (not seen)    Next appt: 04/15/2021 at 01:00 PM in Radiology (MCM-CT)    Dx: Diffuse large B-cell lymphoma of intr...    0 Result Notes   Component Ref Range & Units 3 d ago  (04/01/21) 1 yr ago  (08/11/19) 3 yr ago  (08/18/17) 4 yr ago  (08/05/16) 5 yr ago  (07/31/15) 5 yr ago  (06/11/15) 6 yr ago  (07/27/14)  Sodium 135 - 145 mmol/L 142  140  139  138  139  141  141 R   Potassium 3.5 - 5.1 mmol/L 4.0  4.0  3.5  4.1  4.7  4.3  3.7   Chloride 98 - 111 mmol/L 103  102  100Low R  104 R  104 R  106 R  104 R   CO2 22 - 32 mmol/L _0 R   Glucose, Bld 70 - 99 mg/dL 95  134High  112High R  115High R  101High R  112High R  136High R   Comment: Glucose reference range applies only to samples taken after fasting for at least 8 hours.  BUN 8 - 23 mg/dL 21  25High  24High R  31High R  34High R  24High R  22High R   Creatinine, Ser 0.44 - 1.00 mg/dL 1.08High  1.13High  1.11High  1.27High  1.20High  1.09High   1.40High R   Calcium 8.9 - 10.3 mg/dL 9.5  9.2  9.4  9.6  9.6  9.3  9.0 R   Total Protein 6.5 - 8.1 g/dL 7.1   6.8  7.2  7.0  7.1  6.9 R   Albumin 3.5 - 5.0 g/dL 4.1   3.9  4.3  4.2  4.1  3.6 R   AST 15 - 41 U/L 28   33  32  29  32  35 R   ALT 0 - 44 U/L 12   17 R  16 R  17 R  16 R  30 R, CM   Alkaline Phosphatase 38 - 126 U/L 74   63  57  59  60  78 R, CM   Total Bilirubin 0.3 - 1.2 mg/dL 0.9   0.7  0.5  0.9  0.7  0.6 R   GFR, Estimated >60 mL/min 50Low         Comment: (NOTE)  Calculated using the CKD-EPI Creatinine Equation (2021)   Anion gap 5 - _0 CM  _1 R   Comment: Performed at [facility], [address] [place], [place] [address]  Resulting Agency  _2  [facility] CONVERSION          Specimen Collected: 04/01/21 11:43 Last Resulted: 04/01/21 12:18

## 2021-04-08 NOTE — Telephone Encounter (Signed)
Called patient and explained to her why CT, she voiced undersanding. Also alled and spoke to pts' daughter, Lesleigh Noe, and notified her of results. Per Lesleigh Noe, pt seems to be having problems remembering things. Lesleigh Noe confirmed that pt will keep CT scan on 6/7. Informed her that Dr. Tasia Catchings would like to see her after CT for results.   Please schedule MD follow up on 6/22 @ 2:30p (this was next available opening), but told her that if Dr. Tasia Catchings needed to see her sooner we would r/s and call her with a new appt.  Margie aware of date/time.

## 2021-04-08 NOTE — Telephone Encounter (Signed)
Please schedule MD follow up a couple days after CT for results and notify pt of appts.

## 2021-04-10 ENCOUNTER — Ambulatory Visit: Payer: Medicare Other

## 2021-04-15 ENCOUNTER — Other Ambulatory Visit: Payer: Self-pay

## 2021-04-15 ENCOUNTER — Ambulatory Visit
Admission: RE | Admit: 2021-04-15 | Discharge: 2021-04-15 | Disposition: A | Discharge status: Home / Self Care | Payer: Medicare Other | Source: Ambulatory Visit | Attending: Oncology | Admitting: Oncology

## 2021-04-15 DIAGNOSIS — C8333 Diffuse large B-cell lymphoma, intra-abdominal lymph nodes: Secondary | ICD-10-CM

## 2021-04-15 MED ORDER — IOHEXOL 300 MG/ML  SOLN
100.0000 mL | Freq: Once | INTRAMUSCULAR | Status: AC | PRN
  Administered 2021-04-15: 100 mL via INTRAVENOUS

## 2021-04-17 ENCOUNTER — Telehealth: Payer: Self-pay | Admitting: *Deleted

## 2021-04-17 NOTE — Telephone Encounter (Signed)
Daughter called asking for [place] to call her with results of CT. Patient does have an appointment 6/22 with Dr Tasia Catchings for CT results. Please advise  IMPRESSION: 1. No evidence for lymphadenopathy in the chest, abdomen, or pelvis. Low-density tissue in the hepatoduodenal ligament in central mesentery is nonspecific and may represent trace fluid or unenlarged hypoattenuating lymph nodes. Three-month follow-up abdomen CT could be used to ensure stability as clinically warranted. 2. Mild intrahepatic biliary duct dilatation, similar to prior although minimally progressed since CT of 01/04/2013. Potentially related to prior cholecystectomy. Correlation with liver function test may prove helpful. 3. Left colonic diverticulosis without diverticulitis. 4. Aortic Atherosclerosis (ICD10-I70.0).     Electronically Signed   By: Misty [place] M.D.   On: 04/16/2021 11:06

## 2021-04-18 NOTE — Telephone Encounter (Signed)
Jenny/Lauren, anything I could relay to patient regarding results?

## 2021-04-21 NOTE — Telephone Encounter (Signed)
Patient's daughter notified.

## 2021-04-30 ENCOUNTER — Ambulatory Visit: Payer: Medicare Other | Admitting: Oncology

## 2021-05-07 ENCOUNTER — Encounter: Payer: Self-pay | Admitting: Oncology

## 2021-05-07 ENCOUNTER — Inpatient Hospital Stay: Payer: Medicare Other | Attending: Oncology | Admitting: Oncology

## 2021-05-07 ENCOUNTER — Other Ambulatory Visit: Payer: Self-pay

## 2021-05-07 VITALS — BP 130/58 | HR 58 | Temp 96.4°F | Wt 126.0 lb

## 2021-05-07 DIAGNOSIS — C8333 Diffuse large B-cell lymphoma, intra-abdominal lymph nodes: Secondary | ICD-10-CM | POA: Diagnosis not present

## 2021-05-07 DIAGNOSIS — Z8572 Personal history of non-Hodgkin lymphomas: Secondary | ICD-10-CM | POA: Insufficient documentation

## 2021-05-07 DIAGNOSIS — R634 Abnormal weight loss: Secondary | ICD-10-CM | POA: Diagnosis present

## 2021-05-07 NOTE — Progress Notes (Signed)
Hematology/Oncology follow up note [name] [phone] [phone]   Patient Care Team: Marinda Elk, MD as PCP - General (Physician Assistant)  REFERRING PROVIDER: Marinda Elk, MD  CHIEF COMPLAINTS/REASON FOR VISIT:   Follow up for diffuse large B-cell lymphoma  HISTORY OF PRESENTING ILLNESS:   Rebecca Stokes is a  87 y.o.  female with PMH listed below was seen in consultation at the request of  Marinda Elk, MD  for evaluation of diffuse large B-cell lymphoma  Patient has a history of stage III diffuse large B-cell lymphoma treated in 2010 by Dr. Jeb Levering.  She was status post 7 cycles of R-CHOP.  Patient followed up with Dr. Rogue Bussing and was last seen by him 4 years ago.  Patient and her daughter noticed that patient has unintentional weight loss, about 6 pounds for the past few months and 20 to 30 pounds over the past 12 to 18 months.  Patient does not have good oral intake.  No nausea vomiting diarrhea.  Denies any night sweats, lumps or bumps.  She is fairly active for her age.. Patient has had memory loss, questionable dementia.  PCP feels that patient most likely will benefit from neurology evaluation and patient declined.  Husband is in a skilled nursing facility.  She is able to care for her own ADLs. Patient lives at home by herself.  Her daughter accompanied her to today's visit.  INTERVAL HISTORY Rebecca Stokes is a 87 y.o. female who has above history reviewed by me today presents for follow up visit for management of unintentional weight loss and history of diffuse large B-cell lymphoma. Problems and complaints are listed below: Patient goes to facility to use see her husband and usually eats there.  Per daughter, patient's appetite is not good. Patient reports feeling " well".  Denies any depression, pain, shortness of breath, nausea vomiting diarrhea.    Review of Systems  Constitutional:   Positive for unexpected weight change. Negative for appetite change, chills, fatigue and fever.  HENT:   Negative for hearing loss and voice change.   Eyes:  Negative for eye problems.  Respiratory:  Negative for chest tightness and cough.   Cardiovascular:  Negative for chest pain.  Gastrointestinal:  Negative for abdominal distention, abdominal pain and blood in stool.  Endocrine: Negative for hot flashes.  Genitourinary:  Negative for difficulty urinating and frequency.   Musculoskeletal:  Negative for arthralgias.  Skin:  Negative for itching and rash.  Neurological:  Negative for extremity weakness.  Hematological:  Negative for adenopathy.  Psychiatric/Behavioral:  Negative for confusion.        Memory loss   MEDICAL HISTORY:  Past Medical History:  Diagnosis Date   Cancer ([place])    non hodgkins lymphoma   Hernia    Hypertension    Kidney stone    Thyroid disease     SURGICAL HISTORY: Past Surgical History:  Procedure Laterality Date   ABDOMINAL HYSTERECTOMY  1938   CHOLECYSTECTOMY  1991   COLONOSCOPY  2010   INSERTION CENTRAL VENOUS ACCESS DEVICE W/ SUBCUTANEOUS PORT  2010   Dr Jamal Collin   TONSILLECTOMY     UPPER GI ENDOSCOPY  1981    SOCIAL HISTORY: Social History   Socioeconomic History   Marital status: Widowed    Spouse name: Not on file   Number of children: Not on file   Years of education: Not on file   Highest education level: Not on  file  Occupational History   Not on file  Tobacco Use   Smoking status: Never   Smokeless tobacco: Never  Vaping Use   Vaping Use: Never used  Substance and Sexual Activity   Alcohol use: No   Drug use: No   Sexual activity: Not on file  Other Topics Concern   Not on file  Social History Narrative   Not on file   Social Determinants of Health   Financial Resource Strain: Not on file  Food Insecurity: Not on file  Transportation Needs: Not on file  Physical Activity: Not on file  Stress: Not on file  Social  Connections: Not on file  Intimate Partner Violence: Not on file    FAMILY HISTORY: Family History  Problem Relation Age of Onset   Heart failure Mother    Dementia Father    Breast cancer Neg Hx     ALLERGIES:  is allergic to fish allergy, ciprofloxacin, codeine, ephedrine, phenergan [promethazine hcl], shellfish allergy, sulphur [elemental sulfur], other, and strawberry extract.  MEDICATIONS:  Current Outpatient Medications  Medication Sig Dispense Refill   donepezil (ARICEPT) 5 MG tablet Take 5 mg by mouth daily.     levothyroxine (SYNTHROID, LEVOTHROID) 100 MCG tablet Take 100 mcg by mouth daily before breakfast.      losartan (COZAAR) 50 MG tablet      mirtazapine (REMERON) 7.5 MG tablet Take 7.5 mg by mouth at bedtime.     Multiple Vitamins-Minerals (WOMENS 50+ MULTI VITAMIN/MIN PO) Take by mouth daily.     No current facility-administered medications for this visit.     PHYSICAL EXAMINATION: ECOG PERFORMANCE STATUS: 1 - Symptomatic but completely ambulatory Vitals:   05/07/21 1020  BP: (!) 130/58  Pulse: (!) 58  Temp: (!) 96.4 F (35.8 C)  SpO2: 98%   Filed Weights   05/07/21 1020  Weight: 126 lb (57.2 kg)    Physical Exam Constitutional:      General: She is not in acute distress.    Comments: Frail appearance, she ambulates independently  HENT:     Head: Normocephalic and atraumatic.  Eyes:     General: No scleral icterus. Cardiovascular:     Rate and Rhythm: Normal rate and regular rhythm.     Heart sounds: Normal heart sounds.  Pulmonary:     Effort: Pulmonary effort is normal. No respiratory distress.     Breath sounds: No wheezing.  Abdominal:     General: Bowel sounds are normal. There is no distension.     Palpations: Abdomen is soft.  Musculoskeletal:        General: No deformity. Normal range of motion.     Cervical back: Normal range of motion and neck supple.  Skin:    General: Skin is warm and dry.     Findings: No erythema or rash.   Neurological:     Mental Status: She is alert. Mental status is at baseline.     Cranial Nerves: No cranial nerve deficit.     Coordination: Coordination normal.  Psychiatric:        Mood and Affect: Mood normal.    LABORATORY DATA:  I have reviewed the data as listed Lab Results  Component Value Date   WBC 7.5 04/01/2021   HGB 12.5 04/01/2021   HCT 37.7 04/01/2021   MCV 93.8 04/01/2021   PLT 201 04/01/2021   Recent Labs    04/01/21 1143  NA 142  K 4.0  CL 103  CO2  28  GLUCOSE 95  BUN 21  CREATININE 1.08*  CALCIUM 9.5  GFRNONAA 50*  PROT 7.1  ALBUMIN 4.1  AST 28  ALT 12  ALKPHOS 74  BILITOT 0.9    Iron/TIBC/Ferritin/ %Sat No results found for: IRON, TIBC, FERRITIN, IRONPCTSAT    RADIOGRAPHIC STUDIES: I have personally reviewed the radiological images as listed and agreed with the findings in the report. CT CHEST ABDOMEN PELVIS W CONTRAST  Result Date: 04/16/2021 CLINICAL DATA:  Unintentional weight loss.  History of lymphoma. EXAM: CT CHEST, ABDOMEN, AND PELVIS WITH CONTRAST TECHNIQUE: Multidetector CT imaging of the chest, abdomen and pelvis was performed following the standard protocol during bolus administration of intravenous contrast. CONTRAST:  142mL OMNIPAQUE IOHEXOL 300 MG/ML  SOLN COMPARISON:  PET-CT 08/12/2016 FINDINGS: CT CHEST FINDINGS Cardiovascular: The heart size is normal. No substantial pericardial effusion. Coronary artery calcification is evident. Atherosclerotic calcification is noted in the wall of the thoracic aorta. Mediastinum/Nodes: No mediastinal lymphadenopathy. There is no hilar lymphadenopathy. The esophagus has normal imaging features. There is no axillary lymphadenopathy. Lungs/Pleura: 4 mm right middle lobe pulmonary nodule stable since prior study, consistent with benign etiology. 4 mm ground-glass nodule in the posterior right upper lobe (68/5) is also unchanged. Calcified granuloma noted posterior left lower lobe. No focal airspace  consolidation. There is no evidence of pleural effusion. No new suspicious pulmonary nodule or mass. Musculoskeletal: No worrisome lytic or sclerotic osseous abnormality. CT ABDOMEN PELVIS FINDINGS Hepatobiliary: No suspicious focal abnormality within the liver parenchyma. Mild intrahepatic biliary duct dilatation is similar to prior although minimally progressed since CT of 01/04/2013. Gallbladder surgically absent. Common bile duct is 7-8 mm diameter in the head of pancreas, upper normal for patient age. Pancreas: Pancreas diffusely atrophic without main duct dilatation. Spleen: Tiny hypodensity in the spleen is stable since 2014 consistent with benign etiology. Adrenals/Urinary Tract: No adrenal nodule or mass. Cortical scarring noted in both kidneys. There is mild bilateral caliectasis and prominent extrarenal pelvis in each kidney, superimposed on prominent central sinus cysts bilaterally. No overtly suspicious enhancing renal mass lesion. No gross hydroureter. The urinary bladder appears normal for the degree of distention. Stomach/Bowel: Stomach is unremarkable. No gastric wall thickening. No evidence of outlet obstruction. Duodenum is normally positioned as is the ligament of Treitz. No small bowel wall thickening. No small bowel dilatation. The terminal ileum is normal. The appendix is not well visualized, but there is no edema or inflammation in the region of the cecum. No gross colonic mass. No colonic wall thickening. Diverticular changes are noted in the left colon without evidence of diverticulitis. Vascular/Lymphatic: There is abdominal aortic atherosclerosis without aneurysm. There is no gastrohepatic or hepatoduodenal ligament lymphadenopathy. No retroperitoneal or mesenteric lymphadenopathy. Low-density tissue in the hepatoduodenal ligament in central mesentery (61/2) is nonspecific and may represent trace fluid. No pelvic sidewall lymphadenopathy. Reproductive: The uterus is surgically absent.  There is no adnexal mass. Other: No intraperitoneal free fluid. Musculoskeletal: No worrisome lytic or sclerotic osseous abnormality. IMPRESSION: 1. No evidence for lymphadenopathy in the chest, abdomen, or pelvis. Low-density tissue in the hepatoduodenal ligament in central mesentery is nonspecific and may represent trace fluid or unenlarged hypoattenuating lymph nodes. Three-month follow-up abdomen CT could be used to ensure stability as clinically warranted. 2. Mild intrahepatic biliary duct dilatation, similar to prior although minimally progressed since CT of 01/04/2013. Potentially related to prior cholecystectomy. Correlation with liver function test may prove helpful. 3. Left colonic diverticulosis without diverticulitis. 4. Aortic Atherosclerosis (ICD10-I70.0). Electronically Signed  By: Misty [place] M.D.   On: 04/16/2021 11:06      ASSESSMENT & PLAN:  1. Unintentional weight loss   2. Diffuse large B-cell lymphoma of intra-abdominal lymph nodes (HCC)    History of stage III diffuse large B-cell lymphoma, status post 7 cycles of R-CHOP. Last PET scan was done on 08/12/2016 no evidence of recurrent lymphoma. Labs reviewed and discussed with patient daughter. Negative peripheral blood flow cytometry, normal CBC CMP, smear, LDH CT chest abdomen pelvis with contrast did not show evidence. There is low density tissue in the hepatoduodenal ligament and central mesentery is nonspecific. Patient did have a in 3 months to ensure stability. She has mild slightly progressed since CT of 01/04/2013.  Potentially related to cholecystectomy.  Left chronic diverticulosis without diverticulitis.  Aortic atherosclerosis. Recommend continue follow-up clinically.  Reassurance was given.   #Unintentional weight loss, TSH, free T4 were normal. Recommend patient to establish care with nutritionist.  Recommend count calories. Recommend patient and family to discuss with GP about possible depression/early  onset of dementia.    All questions were answered. The patient knows to call the clinic with any problems questions or concerns.  CC Marinda Elk, MD    Return of visit: CT abdomen in 3 months and a follow-up after that. Thank you for this kind referral and the opportunity to participate in the care of this patient. A copy of today's note is routed to referring provider    Earlie Server, MD, PhD Hematology Oncology [facility] at [facility] Pager- [phone] 05/07/2021

## 2021-08-06 ENCOUNTER — Ambulatory Visit
Admission: RE | Admit: 2021-08-06 | Discharge: 2021-08-06 | Disposition: A | Discharge status: Home / Self Care | Payer: Medicare Other | Source: Ambulatory Visit | Attending: Oncology | Admitting: Oncology

## 2021-08-06 ENCOUNTER — Other Ambulatory Visit: Payer: Self-pay

## 2021-08-06 ENCOUNTER — Inpatient Hospital Stay: Payer: Medicare Other | Attending: Oncology

## 2021-08-06 DIAGNOSIS — C8333 Diffuse large B-cell lymphoma, intra-abdominal lymph nodes: Secondary | ICD-10-CM | POA: Diagnosis present

## 2021-08-06 DIAGNOSIS — R634 Abnormal weight loss: Secondary | ICD-10-CM | POA: Diagnosis not present

## 2021-08-06 DIAGNOSIS — Z8572 Personal history of non-Hodgkin lymphomas: Secondary | ICD-10-CM | POA: Insufficient documentation

## 2021-08-06 LAB — COMPREHENSIVE METABOLIC PANEL
ALT: 11 U/L (ref 0–44)
AST: 28 U/L (ref 15–41)
Albumin: 3.8 g/dL (ref 3.5–5.0)
Alkaline Phosphatase: 63 U/L (ref 38–126)
Anion gap: 8 (ref 5–15)
BUN: 18 mg/dL (ref 8–23)
CO2: 28 mmol/L (ref 22–32)
Calcium: 8.9 mg/dL (ref 8.9–10.3)
Chloride: 100 mmol/L (ref 98–111)
Creatinine, Ser: 1.12 mg/dL — ABNORMAL HIGH (ref 0.44–1.00)
GFR, Estimated: 48 mL/min — ABNORMAL LOW (ref >60)
Glucose, Bld: 95 mg/dL (ref 70–99)
Potassium: 3.9 mmol/L (ref 3.5–5.1)
Sodium: 136 mmol/L (ref 135–145)
Total Bilirubin: 0.9 mg/dL (ref 0.3–1.2)
Total Protein: 6.7 g/dL (ref 6.5–8.1)

## 2021-08-06 LAB — CBC WITH DIFFERENTIAL/PLATELET
Abs Immature Granulocytes: 0.01 10*3/uL (ref 0.00–0.07)
Basophils Absolute: 0.1 10*3/uL (ref 0.0–0.1)
Basophils Relative: 2 %
Eosinophils Absolute: 0.1 10*3/uL (ref 0.0–0.5)
Eosinophils Relative: 3 %
HCT: 36.2 % (ref 36.0–46.0)
Hemoglobin: 12 g/dL (ref 12.0–15.0)
Immature Granulocytes: 0 %
Lymphocytes Relative: 36 %
Lymphs Abs: 1.7 10*3/uL (ref 0.7–4.0)
MCH: 31.2 pg (ref 26.0–34.0)
MCHC: 33.1 g/dL (ref 30.0–36.0)
MCV: 94 fL (ref 80.0–100.0)
Monocytes Absolute: 0.3 10*3/uL (ref 0.1–1.0)
Monocytes Relative: 7 %
Neutro Abs: 2.5 10*3/uL (ref 1.7–7.7)
Neutrophils Relative %: 52 %
Platelets: 211 10*3/uL (ref 150–400)
RBC: 3.85 MIL/uL — ABNORMAL LOW (ref 3.87–5.11)
RDW: 13.4 % (ref 11.5–15.5)
WBC: 4.7 10*3/uL (ref 4.0–10.5)
nRBC: 0 % (ref 0.0–0.2)

## 2021-08-06 LAB — POCT I-STAT CREATININE: Creatinine, Ser: 1.1 mg/dL — ABNORMAL HIGH (ref 0.44–1.00)

## 2021-08-06 LAB — LACTATE DEHYDROGENASE: LDH: 142 U/L (ref 98–192)

## 2021-08-06 MED ORDER — IOHEXOL 350 MG/ML SOLN
75.0000 mL | Freq: Once | INTRAVENOUS | Status: AC | PRN
  Administered 2021-08-06: 75 mL via INTRAVENOUS

## 2021-08-11 ENCOUNTER — Encounter: Payer: Self-pay | Admitting: Oncology

## 2021-08-11 ENCOUNTER — Inpatient Hospital Stay: Payer: Medicare Other | Attending: Oncology | Admitting: Oncology

## 2021-08-11 VITALS — BP 160/75 | HR 62 | Temp 98.1°F | Resp 17 | Wt 126.6 lb

## 2021-08-11 DIAGNOSIS — C8333 Diffuse large B-cell lymphoma, intra-abdominal lymph nodes: Secondary | ICD-10-CM | POA: Diagnosis present

## 2021-08-11 DIAGNOSIS — Z818 Family history of other mental and behavioral disorders: Secondary | ICD-10-CM | POA: Insufficient documentation

## 2021-08-11 DIAGNOSIS — R634 Abnormal weight loss: Secondary | ICD-10-CM

## 2021-08-11 DIAGNOSIS — Z885 Allergy status to narcotic agent status: Secondary | ICD-10-CM | POA: Diagnosis not present

## 2021-08-11 DIAGNOSIS — K838 Other specified diseases of biliary tract: Secondary | ICD-10-CM | POA: Diagnosis not present

## 2021-08-11 DIAGNOSIS — I700 Atherosclerosis of aorta: Secondary | ICD-10-CM | POA: Diagnosis not present

## 2021-08-11 DIAGNOSIS — Z881 Allergy status to other antibiotic agents status: Secondary | ICD-10-CM | POA: Insufficient documentation

## 2021-08-11 DIAGNOSIS — R935 Abnormal findings on diagnostic imaging of other abdominal regions, including retroperitoneum: Secondary | ICD-10-CM

## 2021-08-11 DIAGNOSIS — Z87442 Personal history of urinary calculi: Secondary | ICD-10-CM | POA: Insufficient documentation

## 2021-08-11 DIAGNOSIS — Z888 Allergy status to other drugs, medicaments and biological substances status: Secondary | ICD-10-CM | POA: Diagnosis not present

## 2021-08-11 DIAGNOSIS — R413 Other amnesia: Secondary | ICD-10-CM | POA: Diagnosis not present

## 2021-08-11 DIAGNOSIS — Z9049 Acquired absence of other specified parts of digestive tract: Secondary | ICD-10-CM | POA: Insufficient documentation

## 2021-08-11 DIAGNOSIS — E079 Disorder of thyroid, unspecified: Secondary | ICD-10-CM | POA: Insufficient documentation

## 2021-08-11 DIAGNOSIS — Z8249 Family history of ischemic heart disease and other diseases of the circulatory system: Secondary | ICD-10-CM | POA: Insufficient documentation

## 2021-08-11 NOTE — Progress Notes (Signed)
Hematology/Oncology follow up note [facility] [phone] [phone]   Patient Care Team: Marinda Elk, MD as PCP - General (Physician Assistant)  REFERRING PROVIDER: Marinda Elk, MD  CHIEF COMPLAINTS/REASON FOR VISIT:   Follow up for diffuse large B-cell lymphoma  HISTORY OF PRESENTING ILLNESS:   Rebecca Stokes is a  87 y.o.  female with PMH listed below was seen in consultation at the request of  Marinda Elk, MD  for evaluation of diffuse large B-cell lymphoma  Patient has a history of stage III diffuse large B-cell lymphoma treated in 2010 by Dr. Jeb Levering.  She was status post 7 cycles of R-CHOP.  Patient followed up with Dr. Rogue Bussing and was last seen by him 4 years ago.  Patient and her daughter noticed that patient has unintentional weight loss, about 6 pounds for the past few months and 20 to 30 pounds over the past 12 to 18 months.  Patient does not have good oral intake.  No nausea vomiting diarrhea.  Denies any night sweats, lumps or bumps.  She is fairly active for her age.. Patient has had memory loss, questionable dementia.  PCP feels that patient most likely will benefit from neurology evaluation and patient declined.  Husband is in a skilled nursing facility.  She is able to care for her own ADLs. Patient lives at home by herself.  Her daughter accompanied her to today's visit.  INTERVAL HISTORY Rebecca Stokes is a 87 y.o. female who has above history reviewed by me today presents for follow up visit for management of unintentional weight loss and history of diffuse large B-cell lymphoma. Problems and complaints are listed below: Patient is accompanied by her son-in-law. Patient has had weight loss, since last visit, her weight has been stable. She continues to have memory loss. Appetite is fair.  Denies any other new symptoms.   Review of Systems  Constitutional:  Negative for appetite change,  chills, fatigue, fever and unexpected weight change.  HENT:   Negative for hearing loss and voice change.   Eyes:  Negative for eye problems.  Respiratory:  Negative for chest tightness and cough.   Cardiovascular:  Negative for chest pain.  Gastrointestinal:  Negative for abdominal distention, abdominal pain and blood in stool.  Endocrine: Negative for hot flashes.  Genitourinary:  Negative for difficulty urinating and frequency.   Musculoskeletal:  Negative for arthralgias.  Skin:  Negative for itching and rash.  Neurological:  Negative for extremity weakness.  Hematological:  Negative for adenopathy.  Psychiatric/Behavioral:  Negative for confusion.        Memory loss   MEDICAL HISTORY:  Past Medical History:  Diagnosis Date   Cancer ([place])    non hodgkins lymphoma   Hernia    Hypertension    Kidney stone    Thyroid disease     SURGICAL HISTORY: Past Surgical History:  Procedure Laterality Date   ABDOMINAL HYSTERECTOMY  1938   CHOLECYSTECTOMY  1991   COLONOSCOPY  2010   INSERTION CENTRAL VENOUS ACCESS DEVICE W/ SUBCUTANEOUS PORT  2010   Dr Jamal Collin   TONSILLECTOMY     UPPER GI ENDOSCOPY  1981    SOCIAL HISTORY: Social History   Socioeconomic History   Marital status: Widowed    Spouse name: Not on file   Number of children: Not on file   Years of education: Not on file   Highest education level: Not on file  Occupational History  Not on file  Tobacco Use   Smoking status: Never   Smokeless tobacco: Never  Vaping Use   Vaping Use: Never used  Substance and Sexual Activity   Alcohol use: No   Drug use: No   Sexual activity: Not on file  Other Topics Concern   Not on file  Social History Narrative   Not on file   Social Determinants of Health   Financial Resource Strain: Not on file  Food Insecurity: Not on file  Transportation Needs: Not on file  Physical Activity: Not on file  Stress: Not on file  Social Connections: Not on file  Intimate Partner  Violence: Not on file    FAMILY HISTORY: Family History  Problem Relation Age of Onset   Heart failure Mother    Dementia Father    Breast cancer Neg Hx     ALLERGIES:  is allergic to fish allergy, ciprofloxacin, codeine, ephedrine, phenergan [promethazine hcl], shellfish allergy, sulphur [elemental sulfur], other, and strawberry extract.  MEDICATIONS:  Current Outpatient Medications  Medication Sig Dispense Refill   donepezil (ARICEPT) 5 MG tablet Take 5 mg by mouth daily.     levothyroxine (SYNTHROID, LEVOTHROID) 100 MCG tablet Take 100 mcg by mouth daily before breakfast.      losartan (COZAAR) 50 MG tablet      mirtazapine (REMERON) 7.5 MG tablet Take 7.5 mg by mouth at bedtime.     Multiple Vitamins-Minerals (WOMENS 50+ MULTI VITAMIN/MIN PO) Take by mouth daily.     No current facility-administered medications for this visit.     PHYSICAL EXAMINATION: ECOG PERFORMANCE STATUS: 1 - Symptomatic but completely ambulatory Vitals:   08/11/21 1320  BP: (!) 160/75  Pulse: 62  Resp: 17  Temp: 98.1 F (36.7 C)  SpO2: 99%   Filed Weights   08/11/21 1320  Weight: 126 lb 9.6 oz (57.4 kg)    Physical Exam Constitutional:      General: She is not in acute distress.    Comments: Frail appearance, she ambulates independently  HENT:     Head: Normocephalic and atraumatic.  Eyes:     General: No scleral icterus. Cardiovascular:     Rate and Rhythm: Normal rate and regular rhythm.     Heart sounds: Normal heart sounds.  Pulmonary:     Effort: Pulmonary effort is normal. No respiratory distress.     Breath sounds: No wheezing.  Abdominal:     General: Bowel sounds are normal. There is no distension.     Palpations: Abdomen is soft.  Musculoskeletal:        General: No deformity. Normal range of motion.     Cervical back: Normal range of motion and neck supple.  Skin:    General: Skin is warm and dry.     Findings: No erythema or rash.  Neurological:     Mental  Status: She is alert. Mental status is at baseline.     Cranial Nerves: No cranial nerve deficit.     Coordination: Coordination normal.  Psychiatric:        Mood and Affect: Mood normal.    LABORATORY DATA:  I have reviewed the data as listed Lab Results  Component Value Date   WBC 4.7 08/06/2021   HGB 12.0 08/06/2021   HCT 36.2 08/06/2021   MCV 94.0 08/06/2021   PLT 211 08/06/2021   Recent Labs    04/01/21 1143 08/06/21 1049 08/06/21 1116  NA 142  --  136  K 4.0  --  3.9  CL 103  --  100  CO2 28  --  28  GLUCOSE 95  --  95  BUN 21  --  18  CREATININE 1.08* 1.10* 1.12*  CALCIUM 9.5  --  8.9  GFRNONAA 50*  --  48*  PROT 7.1  --  6.7  ALBUMIN 4.1  --  3.8  AST 28  --  28  ALT 12  --  11  ALKPHOS 74  --  63  BILITOT 0.9  --  0.9    Iron/TIBC/Ferritin/ %Sat No results found for: IRON, TIBC, FERRITIN, IRONPCTSAT    RADIOGRAPHIC STUDIES: I have personally reviewed the radiological images as listed and agreed with the findings in the report. CT Abdomen W Contrast  Result Date: 08/07/2021 CLINICAL DATA:  Weight loss,, evaluate possible lymphadenopathy identified in the hepatoduodenal ligament and central mesentery seen on prior examination. EXAM: CT ABDOMEN WITH CONTRAST TECHNIQUE: Multidetector CT imaging of the abdomen was performed using the standard protocol following bolus administration of intravenous contrast. CONTRAST:  85mL OMNIPAQUE IOHEXOL 350 MG/ML SOLN, additional oral enteric contrast COMPARISON:  CT chest abdomen pelvis, 04/15/2021, PET-CT, 08/12/2016 FINDINGS: Lower chest: No acute abnormality. Hepatobiliary: No focal liver abnormality is seen. Status post cholecystectomy. Mild postoperative biliary ductal dilatation, unchanged. Pancreas: Unremarkable. No pancreatic ductal dilatation or surrounding inflammatory changes. Spleen: Normal in size without focal abnormality. Adrenals/Urinary Tract: Adrenal glands are unremarkable. Bilateral parapelvic cysts. No  hydronephrosis. Bladder is unremarkable. Stomach/Bowel: Stomach is within normal limits. No evidence of bowel wall thickening, distention, or inflammatory changes. Vascular/Lymphatic: Aortic atherosclerosis. 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 Unchanged lymph node or soft tissue nodule in the vicinity of the hepatduodenal ligament and very central small bowel mesentery, anterior to the pancreatic head and uncinate, measuring 2.3 x 1.1 cm (series 2, image 26). Other: No abdominal wall hernia or abnormality. No abdominopelvic ascites. Musculoskeletal: No acute or significant osseous findings. IMPRESSION: 1. Unchanged lymph node or soft tissue nodule in the vicinity of the hepatduodenal ligament and very central small bowel mesentery, anterior to the pancreatic head and uncinate, measuring 2.3 x 1.1 cm. This is of uncertain nature and in the setting of this patient's reported history of lymphoma, is modestly concerning for recurrent malignancy. This finding is new compared to prior PET-CT dated 08/12/2016, and PET-CT could be performed to assess for metabolic activity if desired. 2. 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 Aortic Atherosclerosis (ICD10-I70.0). Electronically Signed   By: Delanna Ahmadi M.D.   On: 08/07/2021 10:50      ASSESSMENT & PLAN:  1. Unintentional weight loss   2. Diffuse large B-cell lymphoma of intra-abdominal lymph nodes ([place])   3. Abnormal CT of the abdomen    History of stage III diffuse large B-cell lymphoma, status post 7 cycles of R-CHOP. Last PET scan was done on 08/12/2016 no evidence of recurrent lymphoma. Labs are reviewed and discussed with patient. Patient has normal LDH, previously negative flow cytometry. Low density tissue in the hepatoduodenal ligament -this is nonspecific but persistent follow-up 3 months follow-up CT scan.  CT scans  were reviewed and discussed. Recommend to pursue PET scan for evaluation of metabolic activity. Patient and her family agree with the plan  #Unintentional weight loss, TSH, free T4 were normal. She has no further weight loss compared to last visit 3 months ago. Most likely secondary  to dementia/depression.  I will defer evaluation of her depression/dementia to her primary care provider   All questions were answered. The patient knows to call the clinic with any problems questions or concerns.  CC Marinda Elk, MD    Return of visit: PET scan, Kidney stable, patient will follow-up in 6 months    Earlie Server, MD, PhD Hematology Oncology [facility] Pager- [phone] 08/11/2021

## 2021-08-11 NOTE — Progress Notes (Signed)
Feels completely well. Appetite is good according to patient. Son in law states she is not eating a whole lot. Pt has energy and stays active. Here for CT results.

## 2021-08-18 ENCOUNTER — Ambulatory Visit: Payer: Medicare Other

## 2021-08-25 ENCOUNTER — Ambulatory Visit
Admission: RE | Admit: 2021-08-25 | Discharge: 2021-08-25 | Disposition: A | Discharge status: Home / Self Care | Payer: Medicare Other | Source: Ambulatory Visit | Attending: Diagnostic Radiology | Admitting: Diagnostic Radiology

## 2021-08-25 ENCOUNTER — Other Ambulatory Visit: Payer: Self-pay

## 2021-08-25 DIAGNOSIS — N200 Calculus of kidney: Secondary | ICD-10-CM | POA: Insufficient documentation

## 2021-08-25 DIAGNOSIS — R634 Abnormal weight loss: Secondary | ICD-10-CM | POA: Diagnosis not present

## 2021-08-25 DIAGNOSIS — I700 Atherosclerosis of aorta: Secondary | ICD-10-CM | POA: Insufficient documentation

## 2021-08-25 DIAGNOSIS — I2510 Atherosclerotic heart disease of native coronary artery without angina pectoris: Secondary | ICD-10-CM | POA: Diagnosis not present

## 2021-08-25 DIAGNOSIS — Z8572 Personal history of non-Hodgkin lymphomas: Secondary | ICD-10-CM | POA: Diagnosis not present

## 2021-08-25 DIAGNOSIS — C8333 Diffuse large B-cell lymphoma, intra-abdominal lymph nodes: Secondary | ICD-10-CM

## 2021-08-25 LAB — GLUCOSE, CAPILLARY: Glucose-Capillary: 91 mg/dL (ref 70–99)

## 2021-08-25 MED ORDER — FLUDEOXYGLUCOSE F - 18 (FDG) INJECTION
6.5000 | Freq: Once | INTRAVENOUS | Status: AC | PRN
  Administered 2021-08-25: 6.8 via INTRAVENOUS

## 2021-08-29 IMAGING — CT CT CHEST-ABD-PELV W/ CM
1 of 3 series · 11 of 30 positions shown, 17 images · IV contrast (omnipaque)
Comparison: PET-CT 08/12/2016

CLINICAL DATA: Unintentional weight loss.  History of lymphoma.

EXAM:
CT CHEST, ABDOMEN, AND PELVIS WITH CONTRAST
TECHNIQUE: Multidetector CT imaging of the chest, abdomen and pelvis was
performed following the standard protocol during bolus
administration of intravenous contrast.
CONTRAST:  100mL OMNIPAQUE IOHEXOL 300 MG/ML  SOLN

[Series 2: cap with · axial · 0.71mm/px · z∈[-996,-501]mm · 11 of 122 slices shown, 17 images]
[im 12/122  mediastinal]
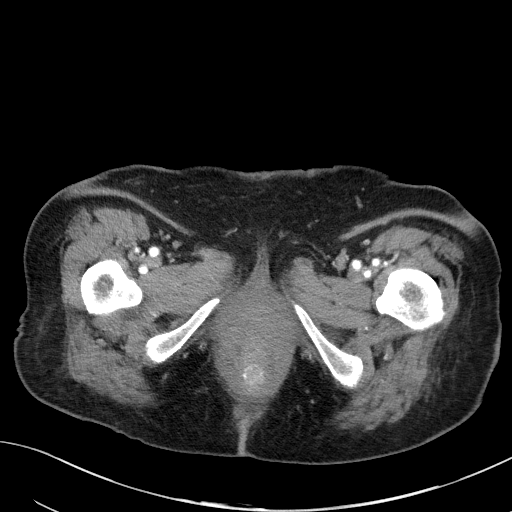
[im 12/122  bone]
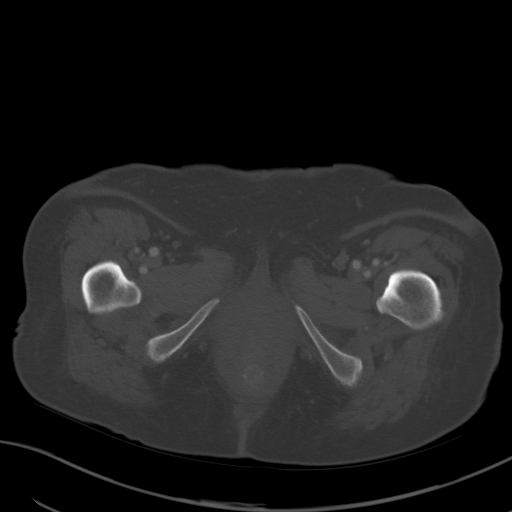
[im 23/122  mediastinal]
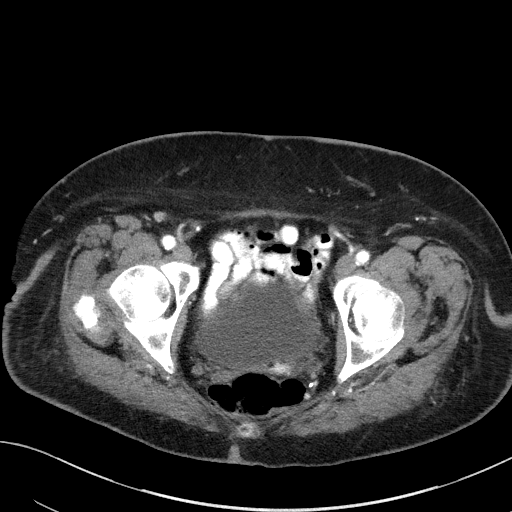
[im 34/122  mediastinal]
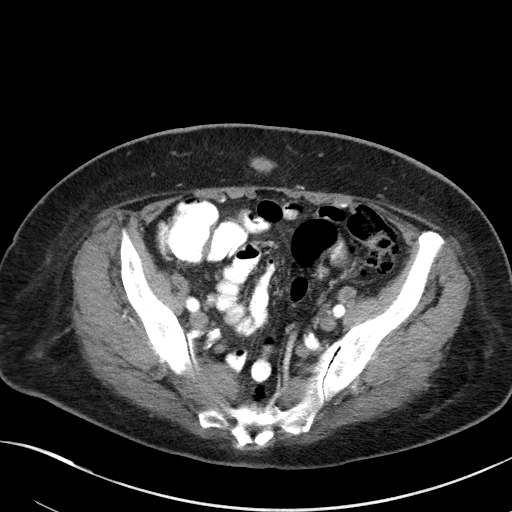
[im 45/122  mediastinal]
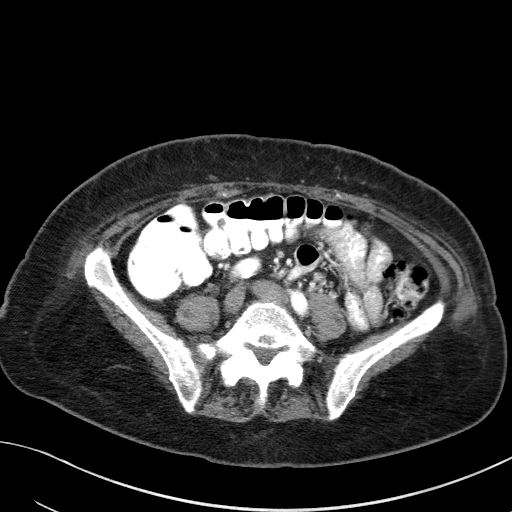
[im 56/122  mediastinal]
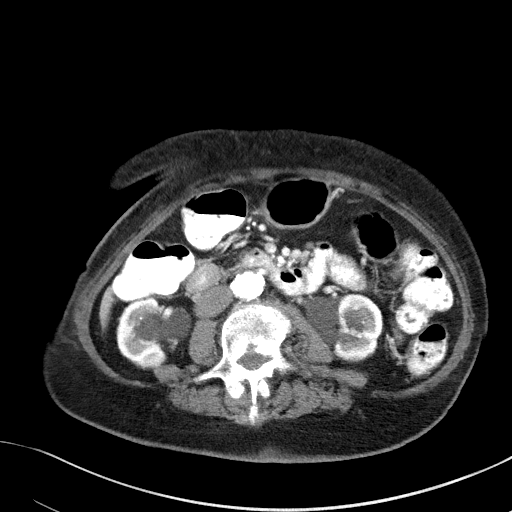
[im 60/122  mediastinal]
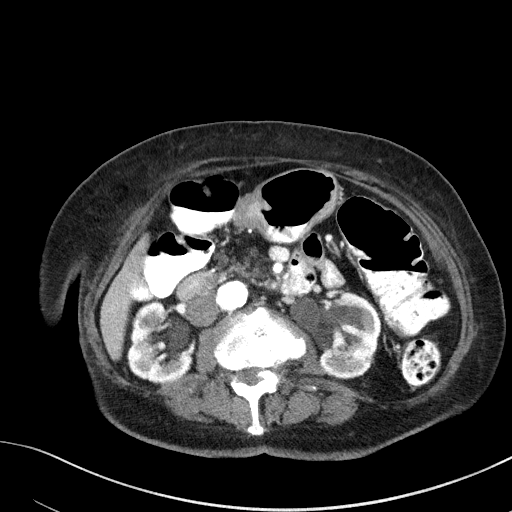
[im 67/122  mediastinal]
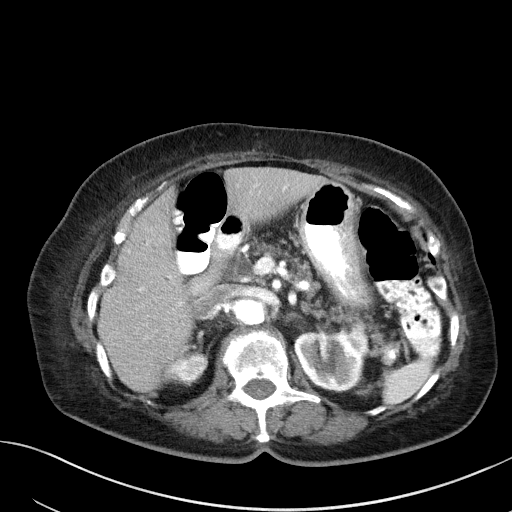
[im 78/122  mediastinal]
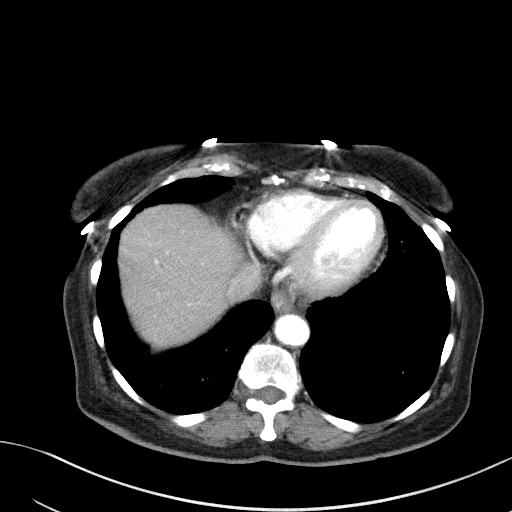
[im 78/122  lung]
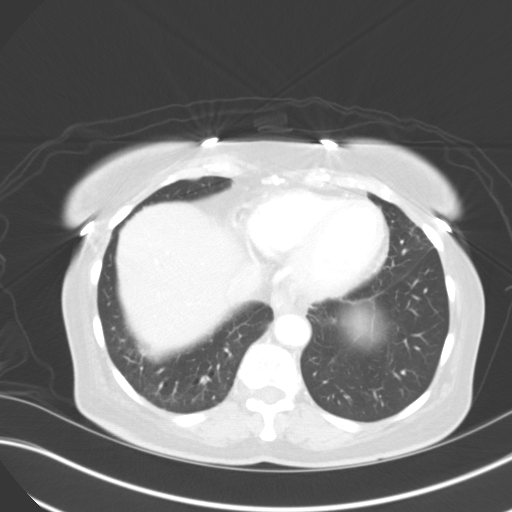
[im 89/122  mediastinal]
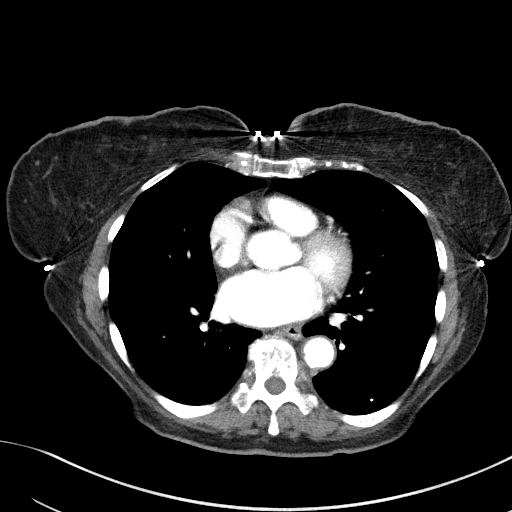
[im 89/122  lung]
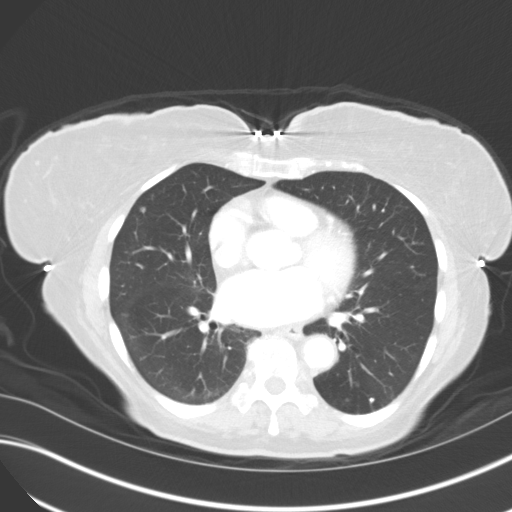
[im 89/122  bone]
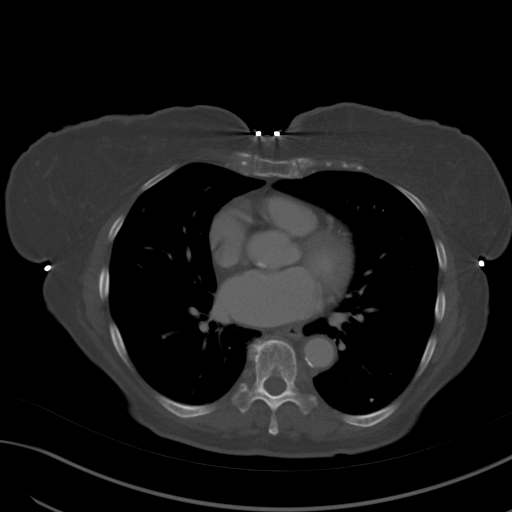
[im 100/122  mediastinal]
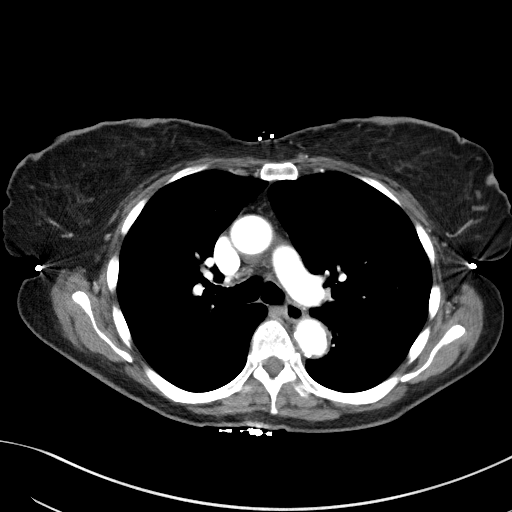
[im 100/122  lung]
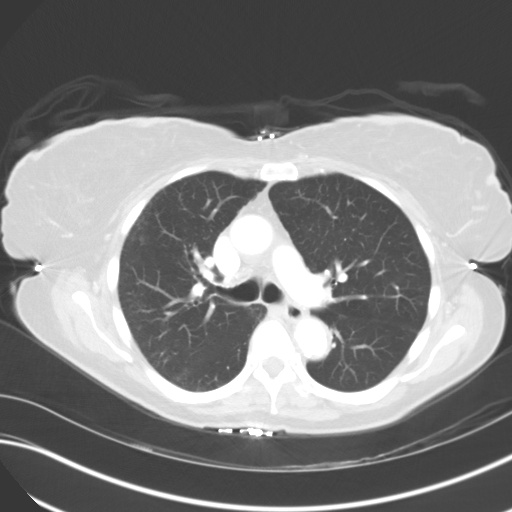
[im 111/122  mediastinal]
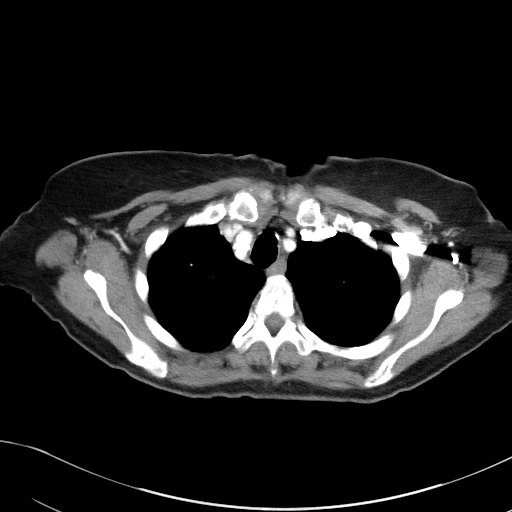
[im 111/122  lung]
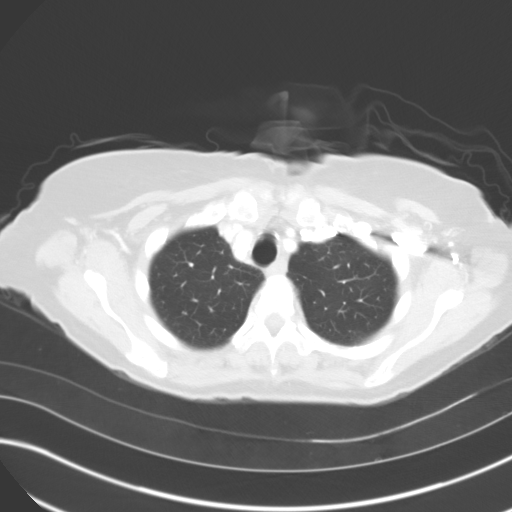

[11 of 30 positions shown; findings below may reference images not displayed]

FINDINGS: CT CHEST FINDINGS

Cardiovascular: The heart size is normal. No substantial pericardial
effusion. Coronary artery calcification is evident. Atherosclerotic
calcification is noted in the wall of the thoracic aorta.

Mediastinum/Nodes: No mediastinal lymphadenopathy. There is no hilar
lymphadenopathy. The esophagus has normal imaging features. There is
no axillary lymphadenopathy.

Lungs/Pleura: 4 mm right middle lobe pulmonary nodule stable since
prior study, consistent with benign etiology. 4 mm ground-glass
nodule in the posterior right upper lobe (68/5) is also unchanged.
Calcified granuloma noted posterior left lower lobe. No focal
airspace consolidation. There is no evidence of pleural effusion. No
new suspicious pulmonary nodule or mass.

Musculoskeletal: No worrisome lytic or sclerotic osseous
abnormality.

CT ABDOMEN PELVIS FINDINGS

Hepatobiliary: No suspicious focal abnormality within the liver
parenchyma. Mild intrahepatic biliary duct dilatation is similar to
prior although minimally progressed since CT of 01/04/2013.
Gallbladder surgically absent. Common bile duct is 7-8 mm diameter
in the head of pancreas, upper normal for patient age.

Pancreas: Pancreas diffusely atrophic without main duct dilatation.

Spleen: Tiny hypodensity in the spleen is stable since 4893
consistent with benign etiology.

Adrenals/Urinary Tract: No adrenal nodule or mass. Cortical scarring
noted in both kidneys. There is mild bilateral caliectasis and
prominent extrarenal pelvis in each kidney, superimposed on
prominent central sinus cysts bilaterally. No overtly suspicious
enhancing renal mass lesion. No gross hydroureter. The urinary
bladder appears normal for the degree of distention.

Stomach/Bowel: Stomach is unremarkable. No gastric wall thickening.
No evidence of outlet obstruction. Duodenum is normally positioned
as is the ligament of Treitz. No small bowel wall thickening. No
small bowel dilatation. The terminal ileum is normal. The appendix
is not well visualized, but there is no edema or inflammation in the
region of the cecum. No gross colonic mass. No colonic wall
thickening. Diverticular changes are noted in the left colon without
evidence of diverticulitis.

Vascular/Lymphatic: There is abdominal aortic atherosclerosis
without aneurysm. There is no gastrohepatic or hepatoduodenal
ligament lymphadenopathy. No retroperitoneal or mesenteric
lymphadenopathy. Low-density tissue in the hepatoduodenal ligament
in central mesentery (61/2) is nonspecific and may represent trace
fluid. No pelvic sidewall lymphadenopathy.

Reproductive: The uterus is surgically absent. There is no adnexal
mass.

Other: No intraperitoneal free fluid.

Musculoskeletal: No worrisome lytic or sclerotic osseous
abnormality.
IMPRESSION: 1. No evidence for lymphadenopathy in the chest, abdomen, or pelvis.
Low-density tissue in the hepatoduodenal ligament in central
mesentery is nonspecific and may represent trace fluid or unenlarged
hypoattenuating lymph nodes. Three-month follow-up abdomen CT could
be used to ensure stability as clinically warranted.
2. Mild intrahepatic biliary duct dilatation, similar to prior
although minimally progressed since CT of 01/04/2013. Potentially
related to prior cholecystectomy. Correlation with liver function
test may prove helpful.
3. Left colonic diverticulosis without diverticulitis.
4. Aortic Atherosclerosis ([id number]).

## 2021-09-02 ENCOUNTER — Telehealth: Payer: Self-pay

## 2021-09-02 NOTE — Telephone Encounter (Signed)
-----   Message from Earlie Server, MD sent at 09/01/2021 10:42 PM EDT ----- Msg sent

## 2021-09-02 NOTE — Telephone Encounter (Signed)
Called pt to inform her that PET scan showed no evidence of recurrent lymphoma. Rebecca Stokes has very small kidnye stones, but if she does not have flank pain it may not need treatment and she may further discuss this with PCP.  Pt verbalized understanding.

## 2022-02-09 ENCOUNTER — Inpatient Hospital Stay: Payer: Medicare Other | Attending: Oncology

## 2022-02-09 ENCOUNTER — Inpatient Hospital Stay: Payer: Medicare Other | Admitting: Oncology

## 2024-02-17 ENCOUNTER — Other Ambulatory Visit: Payer: Self-pay

## 2024-02-17 ENCOUNTER — Encounter: Payer: Self-pay | Admitting: Medical Oncology

## 2024-02-17 ENCOUNTER — Emergency Department

## 2024-02-17 ENCOUNTER — Inpatient Hospital Stay
Admission: EM | Admit: 2024-02-17 | Discharge: 2024-02-20 | DRG: 291 | Disposition: A | Discharge status: Home / Self Care | Attending: Internal Medicine | Admitting: Internal Medicine

## 2024-02-17 DIAGNOSIS — C8333 Diffuse large B-cell lymphoma, intra-abdominal lymph nodes: Secondary | ICD-10-CM | POA: Diagnosis present

## 2024-02-17 DIAGNOSIS — F0394 Unspecified dementia, unspecified severity, with anxiety: Secondary | ICD-10-CM | POA: Diagnosis present

## 2024-02-17 DIAGNOSIS — Z8249 Family history of ischemic heart disease and other diseases of the circulatory system: Secondary | ICD-10-CM

## 2024-02-17 DIAGNOSIS — I10 Essential (primary) hypertension: Secondary | ICD-10-CM

## 2024-02-17 DIAGNOSIS — I2489 Other forms of acute ischemic heart disease: Secondary | ICD-10-CM | POA: Diagnosis present

## 2024-02-17 DIAGNOSIS — I509 Heart failure, unspecified: Secondary | ICD-10-CM | POA: Diagnosis not present

## 2024-02-17 DIAGNOSIS — N1832 Chronic kidney disease, stage 3b: Secondary | ICD-10-CM | POA: Diagnosis present

## 2024-02-17 DIAGNOSIS — E785 Hyperlipidemia, unspecified: Secondary | ICD-10-CM | POA: Diagnosis present

## 2024-02-17 DIAGNOSIS — E039 Hypothyroidism, unspecified: Secondary | ICD-10-CM | POA: Diagnosis present

## 2024-02-17 DIAGNOSIS — Z885 Allergy status to narcotic agent status: Secondary | ICD-10-CM

## 2024-02-17 DIAGNOSIS — R7989 Other specified abnormal findings of blood chemistry: Secondary | ICD-10-CM | POA: Diagnosis not present

## 2024-02-17 DIAGNOSIS — N183 Chronic kidney disease, stage 3 unspecified: Secondary | ICD-10-CM | POA: Insufficient documentation

## 2024-02-17 DIAGNOSIS — I130 Hypertensive heart and chronic kidney disease with heart failure and stage 1 through stage 4 chronic kidney disease, or unspecified chronic kidney disease: Secondary | ICD-10-CM | POA: Diagnosis not present

## 2024-02-17 DIAGNOSIS — Z87442 Personal history of urinary calculi: Secondary | ICD-10-CM

## 2024-02-17 DIAGNOSIS — Z79899 Other long term (current) drug therapy: Secondary | ICD-10-CM

## 2024-02-17 DIAGNOSIS — I5021 Acute systolic (congestive) heart failure: Secondary | ICD-10-CM | POA: Diagnosis present

## 2024-02-17 DIAGNOSIS — Z79890 Hormone replacement therapy (postmenopausal): Secondary | ICD-10-CM

## 2024-02-17 DIAGNOSIS — I2510 Atherosclerotic heart disease of native coronary artery without angina pectoris: Secondary | ICD-10-CM | POA: Diagnosis present

## 2024-02-17 DIAGNOSIS — Z881 Allergy status to other antibiotic agents status: Secondary | ICD-10-CM

## 2024-02-17 DIAGNOSIS — Z9049 Acquired absence of other specified parts of digestive tract: Secondary | ICD-10-CM

## 2024-02-17 DIAGNOSIS — I340 Nonrheumatic mitral (valve) insufficiency: Secondary | ICD-10-CM | POA: Diagnosis present

## 2024-02-17 DIAGNOSIS — R0602 Shortness of breath: Secondary | ICD-10-CM | POA: Diagnosis not present

## 2024-02-17 DIAGNOSIS — E8779 Other fluid overload: Secondary | ICD-10-CM

## 2024-02-17 DIAGNOSIS — Z8572 Personal history of non-Hodgkin lymphomas: Secondary | ICD-10-CM

## 2024-02-17 DIAGNOSIS — Z90710 Acquired absence of both cervix and uterus: Secondary | ICD-10-CM

## 2024-02-17 DIAGNOSIS — I160 Hypertensive urgency: Principal | ICD-10-CM | POA: Diagnosis present

## 2024-02-17 DIAGNOSIS — Z7982 Long term (current) use of aspirin: Secondary | ICD-10-CM

## 2024-02-17 DIAGNOSIS — Z888 Allergy status to other drugs, medicaments and biological substances status: Secondary | ICD-10-CM

## 2024-02-17 LAB — COMPREHENSIVE METABOLIC PANEL WITH GFR
ALT: 14 U/L (ref 0–44)
AST: 46 U/L — ABNORMAL HIGH (ref 15–41)
Albumin: 3.2 g/dL — ABNORMAL LOW (ref 3.5–5.0)
Alkaline Phosphatase: 68 U/L (ref 38–126)
Anion gap: 8 (ref 5–15)
BUN: 24 mg/dL — ABNORMAL HIGH (ref 8–23)
CO2: 26 mmol/L (ref 22–32)
Calcium: 8.8 mg/dL — ABNORMAL LOW (ref 8.9–10.3)
Chloride: 111 mmol/L (ref 98–111)
Creatinine, Ser: 1.26 mg/dL — ABNORMAL HIGH (ref 0.44–1.00)
GFR, Estimated: 41 mL/min — ABNORMAL LOW (ref >60)
Glucose, Bld: 115 mg/dL — ABNORMAL HIGH (ref 70–99)
Potassium: 4.5 mmol/L (ref 3.5–5.1)
Sodium: 145 mmol/L (ref 135–145)
Total Bilirubin: 0.8 mg/dL (ref 0.0–1.2)
Total Protein: 6.7 g/dL (ref 6.5–8.1)

## 2024-02-17 LAB — TROPONIN I (HIGH SENSITIVITY)
Troponin I (High Sensitivity): 40 ng/L — ABNORMAL HIGH (ref <18)
Troponin I (High Sensitivity): 158 ng/L (ref <18)
Troponin I (High Sensitivity): 131 ng/L (ref <18)

## 2024-02-17 LAB — CBC
HCT: 37.1 % (ref 36.0–46.0)
Hemoglobin: 11.9 g/dL — ABNORMAL LOW (ref 12.0–15.0)
MCH: 31 pg (ref 26.0–34.0)
MCHC: 32.1 g/dL (ref 30.0–36.0)
MCV: 96.6 fL (ref 80.0–100.0)
Platelets: 216 10*3/uL (ref 150–400)
RBC: 3.84 MIL/uL — ABNORMAL LOW (ref 3.87–5.11)
RDW: 14.5 % (ref 11.5–15.5)
WBC: 7.4 10*3/uL (ref 4.0–10.5)
nRBC: 0 % (ref 0.0–0.2)

## 2024-02-17 LAB — D-DIMER, QUANTITATIVE: D-Dimer, Quant: 3.68 ug{FEU}/mL — ABNORMAL HIGH (ref 0.00–0.50)

## 2024-02-17 LAB — BRAIN NATRIURETIC PEPTIDE
B Natriuretic Peptide: 1186.3 pg/mL — ABNORMAL HIGH (ref 0.0–100.0)
B Natriuretic Peptide: 1186.8 pg/mL — ABNORMAL HIGH (ref 0.0–100.0)

## 2024-02-17 LAB — PROCALCITONIN: Procalcitonin: <0.1 ng/mL

## 2024-02-17 MED ORDER — IOHEXOL 350 MG/ML SOLN
75.0000 mL | Freq: Once | INTRAVENOUS | Status: AC | PRN
  Administered 2024-02-17: 50 mL via INTRAVENOUS

## 2024-02-17 MED ORDER — LOSARTAN POTASSIUM 50 MG PO TABS
50.0000 mg | ORAL_TABLET | ORAL | Status: AC
  Administered 2024-02-17: 50 mg via ORAL
  Filled 2024-02-17: qty 1

## 2024-02-17 MED ORDER — HYDRALAZINE HCL 20 MG/ML IJ SOLN
2.0000 mg | Freq: Once | INTRAMUSCULAR | Status: AC
  Administered 2024-02-17: 2 mg via INTRAVENOUS
  Filled 2024-02-17: qty 1

## 2024-02-17 MED ORDER — ONDANSETRON HCL 4 MG/2ML IJ SOLN: 4.0000 mg | Freq: Four times a day (QID) | INTRAMUSCULAR | Status: DC | PRN

## 2024-02-17 MED ORDER — ONDANSETRON HCL 4 MG PO TABS: 4.0000 mg | ORAL_TABLET | Freq: Four times a day (QID) | ORAL | Status: DC | PRN

## 2024-02-17 MED ORDER — ASPIRIN 81 MG PO TBEC
81.0000 mg | DELAYED_RELEASE_TABLET | Freq: Every day | ORAL | Status: DC
Start: 2024-02-17 — End: 2024-02-20
  Administered 2024-02-17 – 2024-02-20 (×4): 81 mg via ORAL
  Filled 2024-02-17 (×5): qty 1

## 2024-02-17 MED ORDER — FUROSEMIDE 20 MG PO TABS
20.0000 mg | ORAL_TABLET | Freq: Every day | ORAL | Status: DC
  Administered 2024-02-17: 20 mg via ORAL
  Filled 2024-02-17: qty 1

## 2024-02-17 MED ORDER — FUROSEMIDE 10 MG/ML IJ SOLN
40.0000 mg | Freq: Once | INTRAMUSCULAR | Status: AC
  Administered 2024-02-17: 40 mg via INTRAVENOUS
  Filled 2024-02-17: qty 4

## 2024-02-17 MED ORDER — HYDRALAZINE HCL 20 MG/ML IJ SOLN: 10.0000 mg | INTRAMUSCULAR | Status: DC | PRN

## 2024-02-17 MED ORDER — ENOXAPARIN SODIUM 30 MG/0.3ML IJ SOSY
30.0000 mg | PREFILLED_SYRINGE | INTRAMUSCULAR | Status: DC
  Administered 2024-02-17 – 2024-02-19 (×3): 30 mg via SUBCUTANEOUS
  Filled 2024-02-17 (×3): qty 0.3

## 2024-02-17 NOTE — Assessment & Plan Note (Signed)
 Troponin 40s in setting of volume overload and heart failure No active chest pain Suspect minimal to mild demand ischemia Trend troponin 2D echo pending Baby aspirin Follow cardiology recommendations

## 2024-02-17 NOTE — H&P (Addendum)
 History and Physical    Patient: Rebecca Stokes:[id number] DOB: 08/04/1932 DOA: 02/17/2024 DOS: the patient was seen and examined on 02/17/2024 PCP: Patrice Paradise, MD  Patient coming from: Home  Chief Complaint:  Chief Complaint  Patient presents with   Shortness of Breath   HPI: Rebecca Stokes is a 89 y.o. female with medical history significant of hypertension, prior history of non-Hodgkin's lymphoma presenting with shortness of breath, heart failure, hypertension.  History from patient as well as family.  Per report, patient with worsening shortness of breath over multiple days.  Positive orthopnea, PND, lower extremity swelling.  No reported chest pain.  Positive mild cough.  No abdominal pain, nausea or vomiting.  No known prior history of heart failure past.  Has had remote follow-up with Dr. Darrold Junker at [facility] in the past.  No reported high salt or NSAID intake recently.  Patient currently does live at home alone though her daughter lives nearby within 10 minutes.  No reported recent falls or trauma.  Positive generalized weakness at home. Presented to the ER afebrile, systolic pressures 180s to 190s.  Satting well on room air.  White count 7.4, hemoglobin 11.9, platelets 216, creatinine 1.26 with GFR in the 40s.  Troponin in the 40s.  Procalcitonin less than 0.10.  D-dimer 3.68.  Lower extremity ultrasound negative for PE.  CT angio of the chest with negative for PE.  Positive pulmonary edema and bilateral pleural effusions.  Multichamber cardiomegaly with concern for right heart dysfunction.  Review of Systems: As mentioned in the history of present illness. All other systems reviewed and are negative. Past Medical History:  Diagnosis Date   Cancer (HCC)    non hodgkins lymphoma   Hernia    Hypertension    Kidney stone    Thyroid disease    Past Surgical History:  Procedure Laterality Date   ABDOMINAL HYSTERECTOMY  1938   CHOLECYSTECTOMY  1991    COLONOSCOPY  2010   INSERTION CENTRAL VENOUS ACCESS DEVICE W/ SUBCUTANEOUS PORT  2010   Dr Evette Cristal   TONSILLECTOMY     UPPER GI ENDOSCOPY  1981   Social History:  reports that she has never smoked. She has never used smokeless tobacco. She reports that she does not drink alcohol and does not use drugs.  Allergies  Allergen Reactions   Fish Allergy Anaphylaxis   Ciprofloxacin Nausea And Vomiting   Codeine Itching and Nausea Only   Ephedrine Itching and Nausea Only   Phenergan [Promethazine Hcl] Itching and Nausea Only   Shellfish Allergy     Other reaction(s): UNKNOWN   Sulphur [Elemental Sulfur] Itching and Nausea Only   Other Rash    Blistering rash from sun exposure   Strawberry Extract Rash    Family History  Problem Relation Age of Onset   Heart failure Mother    Dementia Father    Breast cancer Neg Hx     Prior to Admission medications   Medication Sig Start Date End Date Taking? Authorizing Provider  donepezil (ARICEPT) 5 MG tablet Take 5 mg by mouth daily. 10/22/20   [provider]  levothyroxine (SYNTHROID, LEVOTHROID) 100 MCG tablet Take 100 mcg by mouth daily before breakfast.  10/16/13   [provider]  losartan (COZAAR) 50 MG tablet  07/22/20   [provider]  mirtazapine (REMERON) 7.5 MG tablet Take 7.5 mg by mouth at bedtime. 01/15/21   [provider]  Multiple Vitamins-Minerals (WOMENS 50+ MULTI VITAMIN/MIN  PO) Take by mouth daily.    [provider]    Physical Exam: Vitals:   02/17/24 0756 02/17/24 0900 02/17/24 1030 02/17/24 1100  BP: (!) 197/91 (!) 192/85 (!) 193/86 (!) 188/79  Pulse: 85 74 71 69  Resp: 20 (!) 24 20 19   Temp: 98.1 F (36.7 C)     TempSrc: Oral     SpO2: 95% 96% 95% 94%  Weight: 57 kg     Height: 5\' 1"  (1.549 m)      Physical Exam Constitutional:      Appearance: She is normal weight.  HENT:     Head: Normocephalic and atraumatic.     Nose: Nose normal.     Mouth/Throat:      Mouth: Mucous membranes are moist.  Cardiovascular:     Rate and Rhythm: Normal rate and regular rhythm.  Pulmonary:     Effort: Pulmonary effort is normal.  Abdominal:     General: Bowel sounds are normal.  Musculoskeletal:        General: Normal range of motion.     Right lower leg: Edema present.     Left lower leg: Edema present.  Skin:    General: Skin is warm.  Neurological:     General: No focal deficit present.  Psychiatric:        Mood and Affect: Mood normal.     Data Reviewed:  There are no new results to review at this time. CT Angio Chest PE W and/or Wo Contrast CLINICAL DATA:  Shortness of breath  EXAM: CT ANGIOGRAPHY CHEST WITH CONTRAST  TECHNIQUE: Multidetector CT imaging of the chest was performed using the standard protocol during bolus administration of intravenous contrast. Multiplanar CT image reconstructions and MIPs were obtained to evaluate the vascular anatomy.  RADIATION DOSE REDUCTION: This exam was performed according to the departmental dose-optimization program which includes automated exposure control, adjustment of the mA and/or kV according to patient size and/or use of iterative reconstruction technique.  CONTRAST:  50mL OMNIPAQUE IOHEXOL 350 MG/ML SOLN  COMPARISON:  Same day chest radiograph, CT chest dated 04/15/2021  FINDINGS: Cardiovascular: The study is high quality for the evaluation of pulmonary embolism. There are no filling defects in the central, lobar, segmental or subsegmental pulmonary artery branches to suggest acute pulmonary embolism. Great vessels are normal in course and caliber. Multichamber cardiomegaly. RV: LV < 1. no significant pericardial fluid/thickening. Coronary artery calcifications and aortic atherosclerosis. Reflux of contrast material into the hepatic veins, suggesting a degree of right heart dysfunction.  Mediastinum/Nodes: Imaged thyroid gland without nodules meeting criteria for imaging  follow-up by size. Normal esophagus. No pathologically enlarged axillary, supraclavicular, mediastinal, or hilar lymph nodes.  Lungs/Pleura: The central airways are patent. Mild diffuse peribronchial wall thickening with peribronchovascular consolidations. Subsegmental mucous plugging in the lingula and bilateral lower lobes. Mild diffuse interlobular septal thickening with scattered ground-glass opacities, right-greater-than-left. No pneumothorax. Moderate bilateral pleural effusions.  Upper abdomen: Cholecystectomy. Partially imaged left kidney with parapelvic cysts and a presumed mildly complicated cyst arising from the posterior left interpolar kidney (4:141).  Musculoskeletal: No acute or abnormal lytic or blastic osseous lesions. Multilevel degenerative changes of the thoracic spine.  Review of the MIP images confirms the above findings.  IMPRESSION: 1. No evidence of pulmonary embolism. 2. Findings of pulmonary edema with moderate bilateral pleural effusions. 3. Multichamber cardiomegaly with reflux of contrast material into the hepatic veins, suggesting a degree of right heart dysfunction. 4. Aortic Atherosclerosis (ICD10-I70.0). Coronary artery calcifications.  Assessment for potential risk factor modification, dietary therapy or pharmacologic therapy may be warranted, if clinically indicated.  Electronically Signed   By: Agustin Cree M.D.   On: 02/17/2024 11:40 US Venous Img Lower Bilateral CLINICAL DATA:  Shortness of breath. Edema. Positive D-dimer. History of B-cell lymphoma.  EXAM: Bilateral lower Extremity Venous Doppler Ultrasound  TECHNIQUE: Gray-scale sonography with compression, as well as color and duplex ultrasound, were performed to evaluate the deep venous system(s) from the level of the common femoral vein through the popliteal and proximal calf veins.  COMPARISON:  None available  FINDINGS: VENOUS  Normal compressibility of the common femoral,  superficial femoral, and popliteal veins, as well as the visualized calf veins. Visualized portions of profunda femoral vein and great saphenous vein unremarkable. No filling defects to suggest DVT on grayscale or color Doppler imaging. Doppler waveforms show normal direction of venous flow, normal respiratory plasticity and response to augmentation.  OTHER  None.  Limitations: none  IMPRESSION: No  lower extremity DVT.  Electronically Signed   By: Acquanetta Belling M.D.   On: 02/17/2024 11:23 DG Chest 2 View CLINICAL DATA:  Dyspnea  EXAM: CHEST - 2 VIEW  COMPARISON:  Chest x-ray performed August 11, 2019  FINDINGS: Confluent alveolar and interstitial airspace opacities are present in the right mid and upper lung zone. To a lesser extent, interstitial airspace opacities are present on the left. Heart is enlarged. No significant pleural effusion. No pneumothorax.  IMPRESSION: 1. Interstitial and alveolar airspace disease, worse on the right. Given enlarged heart, leading differential consideration is edema. Correlate clinically for signs of infection.  Electronically Signed   By: Lowell Guitar M.D.   On: 02/17/2024 08:57  Lab Results  Component Value Date   WBC 7.4 02/17/2024   HGB 11.9 (L) 02/17/2024   HCT 37.1 02/17/2024   MCV 96.6 02/17/2024   PLT 216 02/17/2024   Last metabolic panel Lab Results  Component Value Date   GLUCOSE 115 (H) 02/17/2024   NA 145 02/17/2024   K 4.5 02/17/2024   CL 111 02/17/2024   CO2 26 02/17/2024   BUN 24 (H) 02/17/2024   CREATININE 1.26 (H) 02/17/2024   GFRNONAA 41 (L) 02/17/2024   CALCIUM 8.8 (L) 02/17/2024   PROT 6.7 02/17/2024   ALBUMIN 3.2 (L) 02/17/2024   BILITOT 0.8 02/17/2024   ALKPHOS 68 02/17/2024   AST 46 (H) 02/17/2024   ALT 14 02/17/2024   ANIONGAP 8 02/17/2024    Assessment and Plan: Shortness of breath Heart failure Progressive shortness of breath over multiple days with noted orthopnea, PND and lower  extremity swelling CTA of the chest with pulmonary edema as well as multichamber cardiomegaly-no PE No known prior history of heart failure in the past We will start on IV diuresis 2D echo Consult cardiology Follow-up recommendations    Elevated troponin Troponin 40s in setting of volume overload and heart failure No active chest pain Suspect minimal to mild demand ischemia Trend troponin 2D echo pending Baby aspirin Follow cardiology recommendations  CKD (chronic kidney disease), stage III (HCC) Creatinine 1.26 with GFR in the 40s Appears near baseline Monitor  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 Cathie Hoops October 2022  BP (high blood pressure) Systolic pressures 180s to 190s on presentation Suspect element of CHF/volume overload As needed IV hydralazine Monitor blood pressure with diuresis Follow      Advance Care Planning:   Code Status: Full Code  Consults: Cardiology  Family Communication: Family at the bedside   Severity of Illness: The appropriate patient status for this patient is OBSERVATION. Observation status is judged to be reasonable and necessary in order to provide the required intensity of service to ensure the patient's safety. The patient's presenting symptoms, physical exam findings, and initial radiographic and laboratory data in the context of their medical condition is felt to place them at decreased risk for further clinical deterioration. Furthermore, it is anticipated that the patient will be medically stable for discharge from the hospital within 2 midnights of admission.   Author: Floydene Flock, MD 02/17/2024 1:35 PM  For on call review [email or web address].

## 2024-02-17 NOTE — Assessment & Plan Note (Signed)
 Creatinine 1.26 with GFR in the 40s Appears near baseline Monitor

## 2024-02-17 NOTE — Assessment & Plan Note (Addendum)
 Heart failure Progressive shortness of breath over multiple days with noted orthopnea, PND and lower extremity swelling CTA of the chest with pulmonary edema as well as multichamber cardiomegaly-no PE No known prior history of heart failure in the past We will start on IV diuresis 2D echo Consult cardiology Follow-up recommendations

## 2024-02-17 NOTE — ED Notes (Signed)
 Pt stated that she had a bad dream and woke up with shortness of breath and tightness in her chest. Family stated theres no history of breathing issues

## 2024-02-17 NOTE — Assessment & Plan Note (Signed)
 History of lymphoma Last follow-up with Dr. Cathie Hoops October 2022

## 2024-02-17 NOTE — ED Provider Notes (Signed)
 [facility] Provider Note    Event Date/Time   First MD Initiated Contact with Patient 02/17/24 [phone]     (approximate)   History   Shortness of Breath   HPI  Rebecca Stokes is a 89 y.o. female has a history of upper respiratory infection, non-Hodgkin's lymphoma, hypothyroidism, B-cell lymphoma,   Patient is here with her daughter who also helps provide history.  The patient has a history of dementia as well.  Patient reports she feels short of breath.  No pain.  It is just a feeling like she cannot take a normal deep breath.  It started this morning a couple hours ago.  She noticed it when she got up.  They have also noticed swelling in both of her feet this morning which is unusual.  She has no history of blood clots.  She has no history of heart problems or heart failure.  She has never had this before.  Her daughter reports this morning they noticed that she had a little bit of a rattling cough.  She was fine yesterday she has not experienced any fevers.  Patient essentially was in her normal health until about 2 hours ago when she started to note that she felt like she just could not take a full deep breath  She has not yet had her levothyroxine or Cozaar this morning but is compliant with her medications  Physical Exam   Triage Vital Signs: ED Triage Vitals  Encounter Vitals Group     BP 02/17/24 0756 (!) 197/91     Systolic BP Percentile --      Diastolic BP Percentile --      Pulse Rate 02/17/24 0756 85     Resp 02/17/24 0756 20     Temp 02/17/24 0756 98.1 F (36.7 C)     Temp Source 02/17/24 0756 Oral     SpO2 02/17/24 0756 95 %     Weight 02/17/24 0756 125 lb 10.6 oz (57 kg)     Height 02/17/24 0756 5\' 1"  (1.549 m)     Head Circumference --      Peak Flow --      Pain Score 02/17/24 0755 0     Pain Loc --      Pain Education --      Exclude from Growth Chart --     Most recent vital signs: Vitals:   02/17/24 0756 02/17/24 0900   BP: (!) 197/91 (!) 192/85  Pulse: 85 74  Resp: 20 (!) 24  Temp: 98.1 F (36.7 C)   SpO2: 95% 96%     General: Awake, no distress.  She is very pleasant along with her daughter at the bedside.  She appears in no acute distress, looks just slightly anxious.  She is alert well-oriented to her daughter able to describe her medical condition CV:  Good peripheral perfusion.  Normal tones and rate Resp:  Normal effort.  Clear upper lobes bilateral.  There are faint crackles in the lower bases on posterior auscultation bilateral.  Her work of breathing appears normal and her oxygen saturation in the mid 90s on room airAbd:  No distention.  Soft nondistended Other:  Moderate bilateral lower extremity edema around her ankles, slight pitting nature as well.   ED Results / Procedures / Treatments   Labs (all labs ordered are listed, but only abnormal results are displayed) Labs Reviewed  D-DIMER, QUANTITATIVE - Abnormal; Notable for the following components:  Result Value   D-Dimer, Quant 3.68 (*)    All other components within normal limits  CBC - Abnormal; Notable for the following components:   RBC 3.84 (*)    Hemoglobin 11.9 (*)    All other components within normal limits  COMPREHENSIVE METABOLIC PANEL WITH GFR - Abnormal; Notable for the following components:   Glucose, Bld 115 (*)    BUN 24 (*)    Creatinine, Ser 1.26 (*)    Calcium 8.8 (*)    Albumin 3.2 (*)    AST 46 (*)    GFR, Estimated 41 (*)    All other components within normal limits  TROPONIN I (HIGH SENSITIVITY) - Abnormal; Notable for the following components:   Troponin I (High Sensitivity) 40 (*)    All other components within normal limits  PROCALCITONIN  BRAIN NATRIURETIC PEPTIDE     EKG  Interpreted by me at 8 AM heart rate 85 QRS 80 QTc 470 Normal sinus rhythm there is no evidence of acute ischemia or ectopy.  septal Q waves   RADIOLOGY  DG Chest 2 View Result Date: 02/17/2024 CLINICAL DATA:   Dyspnea EXAM: CHEST - 2 VIEW COMPARISON:  Chest x-ray performed August 11, 2019 FINDINGS: Confluent alveolar and interstitial airspace opacities are present in the right mid and upper lung zone. To a lesser extent, interstitial airspace opacities are present on the left. Heart is enlarged. No significant pleural effusion. No pneumothorax. IMPRESSION: 1. Interstitial and alveolar airspace disease, worse on the right. Given enlarged heart, leading differential consideration is edema. Correlate clinically for signs of infection. Electronically Signed   By: Lowell Guitar M.D.   On: 02/17/2024 08:57      PROCEDURES:  Critical Care performed: No  Procedures   MEDICATIONS ORDERED IN ED: Medications  hydrALAZINE (APRESOLINE) injection 2 mg (has no administration in time range)  losartan (COZAAR) tablet 50 mg (50 mg Oral Given 02/17/24 0854)  iohexol (OMNIPAQUE) 350 MG/ML injection 75 mL (50 mLs Intravenous Contrast Given 02/17/24 1015)  furosemide (LASIX) injection 40 mg (40 mg Intravenous Given 02/17/24 1106)     IMPRESSION / MDM / ASSESSMENT AND PLAN / ED COURSE  I reviewed the triage vital signs and the nursing notes.                              Differential diagnosis includes, but is not limited to, possible edema especially given the abrupt and new onset of edema in her lower extremities, congestive heart failure, hypertensive urgency/emergency, pneumonia, volume overload, pulmonary hypertension, etc.  She has no chest pain.  Her ECG does not show frank evidence of ACS.  Will check troponin.  Overall her work of breathing appears relatively minimally elevated, but in no acute distress.  Will obtain chest x-ray labs etc.  Low pretest probability for acute thromboembolism especially with equivalent bilateral lower extremity swelling, no associated chest pain no pleuritic pain no noted history of prior PE DVT.  Will send screening D-dimer positive.  CT PE and duplex was performed  Patient's  presentation is most consistent with acute complicated illness / injury requiring diagnostic workup.      Clinical Course as of 02/17/24 1109  Thu Feb 17, 2024  0918 DG Chest 2 View [MQ]    Clinical Course User Index [MQ] Sharyn Creamer, MD   ----------------------------------------- 11:08 AM on 02/17/2024 -----------------------------------------  Patient understanding as well as her daughter understanding of plan for  admission  Consulted with and patient accepted to hospitalist by Dr. Alvester Morin.  Understands that CT chest final report by radiology is pending  FINAL CLINICAL IMPRESSION(S) / ED DIAGNOSES   Final diagnoses:  Hypertensive urgency  Volume overload state of heart     Rx / DC Orders   ED Discharge Orders     None        Note:  This document was prepared using Dragon voice recognition software and may include unintentional dictation errors.   Sharyn Creamer, MD 02/17/24 1110

## 2024-02-17 NOTE — Progress Notes (Signed)
 Pt  admitted A&OX4. No c/o of WOB. BP (!) 174/72 (BP Location: Right Arm)   Pulse 72   Temp 98 F (36.7 C) (Oral)   Resp 18   Ht 5\' 1"  (1.549 m)   Wt 57 kg   SpO2 99%   BMI 23.74 kg/m  Pt oriented to floor, 2/4 rails up, wheels locked, bed in lowest position. No tele/ no IV fluids. No further needs voiced at this time. No skin issues. Reva Bores  02/17/24 5:23 PM

## 2024-02-17 NOTE — Progress Notes (Signed)
 PHARMACIST - PHYSICIAN COMMUNICATION  CONCERNING:  Enoxaparin (Lovenox) for DVT Prophylaxis   ASSESSMENT: Patient was prescribed enoxaparin 30 mg subcutaneously every 24 hours for VTE prophylaxis.   Body mass index is 23.74 kg/m.  Estimated Creatinine Clearance: 22.8 mL/min (A) (by C-G formula based on SCr of 1.26 mg/dL (H)).  Based on [facility] policy, patient qualifies for enoxaparin dosing of 30 mg every 24 hours because their creatinine clearance is <30 mL/min.  PLAN: Pharmacy has adjusted enoxaparin dose per [facility] policy.  Description: Patient is now receiving enoxaparin 30 mg subcutaneously every 24 hours.  Will M. Dareen Piano, PharmD Clinical Pharmacist 02/17/2024 1:25 PM

## 2024-02-17 NOTE — ED Triage Notes (Signed)
 PT reports waking from sleep about 2 hrs ago with sob. Denies pain. Denies cough, states that she just feels like she can't catch her breath and she's feeling "shaky".

## 2024-02-17 NOTE — Assessment & Plan Note (Signed)
 Systolic pressures 180s to 190s on presentation Suspect element of CHF/volume overload As needed IV hydralazine Monitor blood pressure with diuresis Follow

## 2024-02-18 ENCOUNTER — Observation Stay
Admit: 2024-02-18 | Discharge: 2024-02-18 | Disposition: A | Discharge status: Home / Self Care | Attending: Family Medicine | Admitting: Family Medicine

## 2024-02-18 DIAGNOSIS — Z79890 Hormone replacement therapy (postmenopausal): Secondary | ICD-10-CM | POA: Diagnosis not present

## 2024-02-18 DIAGNOSIS — Z8249 Family history of ischemic heart disease and other diseases of the circulatory system: Secondary | ICD-10-CM | POA: Diagnosis not present

## 2024-02-18 DIAGNOSIS — Z79899 Other long term (current) drug therapy: Secondary | ICD-10-CM | POA: Diagnosis not present

## 2024-02-18 DIAGNOSIS — Z90710 Acquired absence of both cervix and uterus: Secondary | ICD-10-CM | POA: Diagnosis not present

## 2024-02-18 DIAGNOSIS — R7989 Other specified abnormal findings of blood chemistry: Secondary | ICD-10-CM | POA: Diagnosis not present

## 2024-02-18 DIAGNOSIS — Z881 Allergy status to other antibiotic agents status: Secondary | ICD-10-CM | POA: Diagnosis not present

## 2024-02-18 DIAGNOSIS — F0394 Unspecified dementia, unspecified severity, with anxiety: Secondary | ICD-10-CM | POA: Diagnosis present

## 2024-02-18 DIAGNOSIS — E785 Hyperlipidemia, unspecified: Secondary | ICD-10-CM | POA: Diagnosis present

## 2024-02-18 DIAGNOSIS — Z7982 Long term (current) use of aspirin: Secondary | ICD-10-CM | POA: Diagnosis not present

## 2024-02-18 DIAGNOSIS — Z888 Allergy status to other drugs, medicaments and biological substances status: Secondary | ICD-10-CM | POA: Diagnosis not present

## 2024-02-18 DIAGNOSIS — N1831 Chronic kidney disease, stage 3a: Secondary | ICD-10-CM | POA: Diagnosis not present

## 2024-02-18 DIAGNOSIS — R0602 Shortness of breath: Secondary | ICD-10-CM | POA: Diagnosis present

## 2024-02-18 DIAGNOSIS — I509 Heart failure, unspecified: Secondary | ICD-10-CM

## 2024-02-18 DIAGNOSIS — I160 Hypertensive urgency: Secondary | ICD-10-CM | POA: Diagnosis present

## 2024-02-18 DIAGNOSIS — I2489 Other forms of acute ischemic heart disease: Secondary | ICD-10-CM | POA: Diagnosis present

## 2024-02-18 DIAGNOSIS — Z885 Allergy status to narcotic agent status: Secondary | ICD-10-CM | POA: Diagnosis not present

## 2024-02-18 DIAGNOSIS — I130 Hypertensive heart and chronic kidney disease with heart failure and stage 1 through stage 4 chronic kidney disease, or unspecified chronic kidney disease: Secondary | ICD-10-CM | POA: Diagnosis present

## 2024-02-18 DIAGNOSIS — Z87442 Personal history of urinary calculi: Secondary | ICD-10-CM | POA: Diagnosis not present

## 2024-02-18 DIAGNOSIS — Z9049 Acquired absence of other specified parts of digestive tract: Secondary | ICD-10-CM | POA: Diagnosis not present

## 2024-02-18 DIAGNOSIS — E039 Hypothyroidism, unspecified: Secondary | ICD-10-CM | POA: Diagnosis present

## 2024-02-18 DIAGNOSIS — I5021 Acute systolic (congestive) heart failure: Secondary | ICD-10-CM | POA: Diagnosis present

## 2024-02-18 DIAGNOSIS — I2510 Atherosclerotic heart disease of native coronary artery without angina pectoris: Secondary | ICD-10-CM | POA: Diagnosis present

## 2024-02-18 DIAGNOSIS — I340 Nonrheumatic mitral (valve) insufficiency: Secondary | ICD-10-CM | POA: Diagnosis present

## 2024-02-18 DIAGNOSIS — N1832 Chronic kidney disease, stage 3b: Secondary | ICD-10-CM | POA: Diagnosis present

## 2024-02-18 DIAGNOSIS — C8333 Diffuse large B-cell lymphoma, intra-abdominal lymph nodes: Secondary | ICD-10-CM | POA: Diagnosis present

## 2024-02-18 DIAGNOSIS — I10 Essential (primary) hypertension: Secondary | ICD-10-CM | POA: Diagnosis not present

## 2024-02-18 LAB — COMPREHENSIVE METABOLIC PANEL WITH GFR
ALT: 13 U/L (ref 0–44)
AST: 32 U/L (ref 15–41)
Albumin: 2.7 g/dL — ABNORMAL LOW (ref 3.5–5.0)
Alkaline Phosphatase: 50 U/L (ref 38–126)
Anion gap: 9 (ref 5–15)
BUN: 23 mg/dL (ref 8–23)
CO2: 26 mmol/L (ref 22–32)
Calcium: 8.3 mg/dL — ABNORMAL LOW (ref 8.9–10.3)
Chloride: 107 mmol/L (ref 98–111)
Creatinine, Ser: 1.2 mg/dL — ABNORMAL HIGH (ref 0.44–1.00)
GFR, Estimated: 43 mL/min — ABNORMAL LOW (ref >60)
Glucose, Bld: 82 mg/dL (ref 70–99)
Potassium: 3.7 mmol/L (ref 3.5–5.1)
Sodium: 142 mmol/L (ref 135–145)
Total Bilirubin: 0.9 mg/dL (ref 0.0–1.2)
Total Protein: 5.2 g/dL — ABNORMAL LOW (ref 6.5–8.1)

## 2024-02-18 LAB — ECHOCARDIOGRAM COMPLETE
AR max vel: 1.72 cm2
AV Area VTI: 1.8 cm2
AV Area mean vel: 1.89 cm2
AV Mean grad: 8.5 mmHg
AV Peak grad: 17.9 mmHg
Ao pk vel: 2.12 m/s
Area-P 1/2: 3.19 cm2
Calc EF: 45.3 %
Est EF: 45
Height: 61 in
MV M vel: 5.47 m/s
MV Peak grad: 119.7 mmHg
MV VTI: 2.67 cm2
Radius: 0.5 cm
S' Lateral: 3.7 cm
Single Plane A2C EF: 43.8 %
Single Plane A4C EF: 45.2 %
Weight: 2194.02 [oz_av]

## 2024-02-18 LAB — CBC
HCT: 37.4 % (ref 36.0–46.0)
Hemoglobin: 12.7 g/dL (ref 12.0–15.0)
MCH: 30.8 pg (ref 26.0–34.0)
MCHC: 34 g/dL (ref 30.0–36.0)
MCV: 90.6 fL (ref 80.0–100.0)
Platelets: 238 10*3/uL (ref 150–400)
RBC: 4.13 MIL/uL (ref 3.87–5.11)
RDW: 14 % (ref 11.5–15.5)
WBC: 8.4 10*3/uL (ref 4.0–10.5)
nRBC: 0 % (ref 0.0–0.2)

## 2024-02-18 MED ORDER — FUROSEMIDE 10 MG/ML IJ SOLN: 40.0000 mg | Freq: Two times a day (BID) | INTRAMUSCULAR | Status: DC

## 2024-02-18 MED ORDER — MIRTAZAPINE 15 MG PO TABS
30.0000 mg | ORAL_TABLET | Freq: Every day | ORAL | Status: DC
  Administered 2024-02-18 – 2024-02-19 (×2): 30 mg via ORAL
  Filled 2024-02-18 (×2): qty 2

## 2024-02-18 MED ORDER — DONEPEZIL HCL 5 MG PO TABS
10.0000 mg | ORAL_TABLET | Freq: Every day | ORAL | Status: DC
  Administered 2024-02-18 – 2024-02-20 (×3): 10 mg via ORAL
  Filled 2024-02-18 (×3): qty 2

## 2024-02-18 MED ORDER — FUROSEMIDE 10 MG/ML IJ SOLN
40.0000 mg | Freq: Every day | INTRAMUSCULAR | Status: DC
  Administered 2024-02-18 – 2024-02-19 (×2): 40 mg via INTRAVENOUS
  Filled 2024-02-18 (×3): qty 4

## 2024-02-18 MED ORDER — TRAZODONE HCL 50 MG PO TABS
25.0000 mg | ORAL_TABLET | Freq: Once | ORAL | Status: AC
  Administered 2024-02-18: 25 mg via ORAL
  Filled 2024-02-18: qty 1

## 2024-02-18 MED ORDER — MEMANTINE HCL 5 MG PO TABS
10.0000 mg | ORAL_TABLET | Freq: Two times a day (BID) | ORAL | Status: DC
  Administered 2024-02-18 – 2024-02-20 (×5): 10 mg via ORAL
  Filled 2024-02-18 (×5): qty 2

## 2024-02-18 MED ORDER — FUROSEMIDE 10 MG/ML IJ SOLN: 40.0000 mg | Freq: Every day | INTRAMUSCULAR | Status: DC

## 2024-02-18 MED ORDER — LOSARTAN POTASSIUM 50 MG PO TABS
50.0000 mg | ORAL_TABLET | Freq: Every day | ORAL | Status: DC
  Administered 2024-02-18 – 2024-02-20 (×3): 50 mg via ORAL
  Filled 2024-02-18 (×4): qty 1

## 2024-02-18 MED ORDER — LEVOTHYROXINE SODIUM 100 MCG PO TABS
100.0000 ug | ORAL_TABLET | Freq: Every day | ORAL | Status: DC
  Administered 2024-02-18 – 2024-02-20 (×3): 100 ug via ORAL
  Filled 2024-02-18 (×3): qty 1

## 2024-02-18 NOTE — Progress Notes (Signed)
       CROSS COVER NOTE  NAME: Rebecca Stokes MRN: [id number] DOB : 01/23/1932 ATTENDING PHYSICIAN: Darlin Priestly, MD    Date of Service   02/18/2024   HPI/Events of Note   Nurse reports and witnessed,, patient does not sleep, up and down all night.   Interventions   Assessment/Plan: She is pleasant and redirectable but does not sleep. Shows some anxiety when in room by herself  as well.  Meds reviewed Try low trazodone dose         Donnie Mesa NP [facility] Cross Cover 7pm-7am - check amion for availability Pager [phone]

## 2024-02-18 NOTE — Progress Notes (Signed)
  PROGRESS NOTE    Rebecca Stokes  ZOX:[id number] DOB: 21-Aug-1932 DOA: 02/17/2024 PCP: Patrice Paradise, MD  117A/117A-AA  LOS: 0 days   Brief hospital course:   Assessment & Plan: Rebecca Stokes is a 89 y.o. female with medical history significant of hypertension, prior history of non-Hodgkin's lymphoma, dementia, presenting with worsening shortness of breath over multiple days.  Positive orthopnea, PND, lower extremity swelling.    Shortness of breath 2/2 Pulm edema and Bilateral pleural effusion Progressive shortness of breath over multiple days with noted orthopnea, PND and lower extremity swelling.  BNP 1186 CTA of the chest with pulmonary edema as well as multichamber cardiomegaly-no PE No known prior history of heart failure in the past --started on IV lasix 40 with good response.  Cardio consulted --cont IV lasix 40 mg daily --Echo  Elevated troponin 2/2 demand ischemia  CKD (chronic kidney disease), stage IIIb (HCC) Appears near baseline Monitor while diuresing  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 Cathie Hoops October 2022  HTN --BP elevated on presentation --cont losartan --cont IV lasix   DVT prophylaxis: Lovenox SQ Code Status: Full code  Family Communication: daughter updated at bedside today Level of care: Med-Surg Dispo:   The patient is from: home Anticipated d/c is to: home Anticipated d/c date is: 1-2 days   Subjective and Interval History:  Pt reported dyspnea improved.  Pt reported good urine output.   Objective: Vitals:   02/18/24 0409 02/18/24 0500 02/18/24 0833 02/18/24 1606  BP: (!) 165/86  (!) 155/74 (!) 150/90  Pulse: 64  71 73  Resp: 18  16 17   Temp: 98.3 F (36.8 C)  97.9 F (36.6 C) 98 F (36.7 C)  TempSrc: Oral     SpO2: 99%  99% 97%  Weight:  62.2 kg    Height:        Intake/Output Summary (Last 24 hours) at 02/18/2024 1959 Last data filed at  02/18/2024 1500 Gross per 24 hour  Intake 240 ml  Output 400 ml  Net -160 ml   Filed Weights   02/17/24 0756 02/18/24 0500  Weight: 57 kg 62.2 kg    Examination:   Constitutional: NAD, alert, oriented to person and place HEENT: conjunctivae and lids normal, EOMI CV: No cyanosis.   RESP: normal respiratory effort, on RA Neuro: II - XII grossly intact.   Psych: Normal mood and affect.     Data Reviewed: I have personally reviewed labs and imaging studies  Time spent: 50 minutes  Darlin Priestly, MD [facility] If 7PM-7AM, please contact night-coverage 02/18/2024, 7:59 PM

## 2024-02-18 NOTE — Consult Note (Addendum)
 [facility] CARDIOLOGY CONSULT NOTE       Patient ID: Rebecca Stokes MRN: [id number] DOB/AGE: 17-Jul-1932 89 y.o.  Admit date: 02/17/2024 Referring Physician Dr. Alvester Morin Primary Physician Merlinda Frederick, Merleen Milliner, MD  Primary Cardiologist Dr. Rudi Heap, PA-C Reason for Consultation Heart Failure  HPI: Rebecca Stokes is a 89 y.o. female  with a past medical history of coronary artery disease, hypertension, hyperlipidemia, statin intolerance, chronic kidney disease, hx of non-Hodgkin's lymphoma  who presented to the ED on 02/17/2024 for shortness of breath. Cardiology was consulted for further evaluation.   Portions of history obtained via chart review as patient is a difficult historian. Patient presented to the ED yesterday with shortness of breath that's been going on for about 1 week. Patient endorses cough and denies chest pain. Work up in the ED notable for Na 145, K 4.5, Cr 1.26, Hgb 11.9, WBC 7.4, platelets 216. D-dimer 3.68. Lower extremity venous US showed no evidence of DVT. CTA showed no evidence of pulmonary embolism and evidence of pulmonary edema and bilateral pleural effusions. CXR showed bilateral pulmonary edema. BNP elevated 1,186. Troponins elevated and trending flat 40 > 158 > 131. EKG in ED showed sinus rhythm , rate 84 bpm with no notable ST changes. Patient was started on ASA 81 mg. Received 1 dose of IV lasix 40 mg.   At the time of my evaluation this morning, patient was sitting up in bedside chair, comfortable in no distress. Patient states she's been having shortness of breath for the past week. Patient reports shortness of breath is better this morning s/p 1 dose of IV lasix 40 mg. Patient endorses associated cough and denies any chest pain. Did not report any worsening LE edema but per admission notes had 3+ edema on exam yesterday. Endorses good UOP. BP has been elevated since admission.   Review of systems complete and found to be negative unless listed  above    Past Medical History:  Diagnosis Date   Cancer (HCC)    non hodgkins lymphoma   Hernia    Hypertension    Kidney stone    Thyroid disease     Past Surgical History:  Procedure Laterality Date   ABDOMINAL HYSTERECTOMY  1938   CHOLECYSTECTOMY  1991   COLONOSCOPY  2010   INSERTION CENTRAL VENOUS ACCESS DEVICE W/ SUBCUTANEOUS PORT  2010   Dr Evette Cristal   TONSILLECTOMY     UPPER GI ENDOSCOPY  1981    Medications Prior to Admission  Medication Sig Dispense Refill Last Dose/Taking   donepezil (ARICEPT) 10 MG tablet Take 10 mg by mouth daily.   02/16/2024   levothyroxine (SYNTHROID, LEVOTHROID) 100 MCG tablet Take 100 mcg by mouth daily before breakfast.    02/16/2024   losartan (COZAAR) 50 MG tablet Take 50 mg by mouth daily.   02/16/2024   memantine (NAMENDA) 10 MG tablet Take 1 tablet by mouth 2 (two) times daily.   02/16/2024   mirtazapine (REMERON) 30 MG tablet Take 30 mg by mouth at bedtime.   02/16/2024   Multiple Vitamins-Minerals (WOMENS 50+ MULTI VITAMIN/MIN PO) Take by mouth daily.   02/16/2024   Social History   Socioeconomic History   Marital status: Widowed    Spouse name: Not on file   Number of children: Not on file   Years of education: Not on file   Highest education level: Not on file  Occupational History   Not on file  Tobacco Use   Smoking  status: Never   Smokeless tobacco: Never  Vaping Use   Vaping status: Never Used  Substance and Sexual Activity   Alcohol use: No   Drug use: No   Sexual activity: Not on file  Other Topics Concern   Not on file  Social History Narrative   Not on file   Social Drivers of Health   Financial Resource Strain: Low Risk  (09/21/2023)   Received from [facility]   Overall Financial Resource Strain (CARDIA)    Difficulty of Paying Living Expenses: Not hard at all  Food Insecurity: No Food Insecurity (02/17/2024)   Hunger Vital Sign    Worried About Running Out of Food in the Last Year: Never true     Ran Out of Food in the Last Year: Never true  Transportation Needs: No Transportation Needs (02/17/2024)   PRAPARE - Administrator, Civil Service (Medical): No    Lack of Transportation (Non-Medical): No  Physical Activity: Sufficiently Active (06/21/2018)   Received from [facility]   Exercise Vital Sign    Days of Exercise per Week: 6 days    Minutes of Exercise per Session: 30 min  Stress: Not on file  Social Connections: Moderately Isolated (02/17/2024)   Social Connection and Isolation Panel [NHANES]    Frequency of Communication with Friends and Family: Three times a week    Frequency of Social Gatherings with Friends and Family: Three times a week    Attends Religious Services: Never    Active Member of Clubs or Organizations: No    Attends Banker Meetings: 1 to 4 times per year    Marital Status: Widowed  Intimate Partner Violence: Not At Risk (02/17/2024)   Humiliation, Afraid, Rape, and Kick questionnaire    Fear of Current or Ex-Partner: No    Emotionally Abused: No    Physically Abused: No    Sexually Abused: No    Family History  Problem Relation Age of Onset   Heart failure Mother    Dementia Father    Breast cancer Neg Hx      Vitals:   02/17/24 2020 02/18/24 0409 02/18/24 0500 02/18/24 0833  BP: (!) 163/70 (!) 165/86  (!) 155/74  Pulse: 64 64  71  Resp: 18 18  16   Temp: 98.5 F (36.9 C) 98.3 F (36.8 C)  97.9 F (36.6 C)  TempSrc: Oral Oral    SpO2: 98% 99%  99%  Weight:   62.2 kg   Height:        PHYSICAL EXAM General: Well appearing elderly female, well nourished, in no acute distress. HEENT: Normocephalic and atraumatic. Neck: No JVD.  Lungs: Normal respiratory effort on room air. Diminished bilaterally.  Heart: HRRR. Normal S1 and S2 without gallops or murmurs.  Abdomen: Non-distended appearing.  Msk: Normal strength and tone for age. Extremities: Warm and well perfused. No clubbing, cyanosis. 1+  pitting edema bilaterally.  Neuro: Alert and oriented X 3. Psych: Answers questions appropriately.   Labs: Basic Metabolic Panel: Recent Labs    02/17/24 0836 02/18/24 0536  NA 145 142  K 4.5 3.7  CL 111 107  CO2 26 26  GLUCOSE 115* 82  BUN 24* 23  CREATININE 1.26* 1.20*  CALCIUM 8.8* 8.3*   Liver Function Tests: Recent Labs    02/17/24 0836 02/18/24 0536  AST 46* 32  ALT 14 13  ALKPHOS 68 50  BILITOT 0.8 0.9  PROT 6.7 5.2*  ALBUMIN 3.2* 2.7*   No results for input(s): "LIPASE", "AMYLASE" in the last 72 hours. CBC: Recent Labs    02/17/24 0836 02/18/24 0729  WBC 7.4 8.4  HGB 11.9* 12.7  HCT 37.1 37.4  MCV 96.6 90.6  PLT 216 238   Cardiac Enzymes: Recent Labs    02/17/24 0836 02/17/24 1927 02/17/24 2135  TROPONINIHS 40* 158* 131*   BNP: Recent Labs    02/17/24 1927 02/17/24 2135  BNP 1,186.3* 1,186.8*   D-Dimer: Recent Labs    02/17/24 0827  DDIMER 3.68*   Hemoglobin A1C: No results for input(s): "HGBA1C" in the last 72 hours. Fasting Lipid Panel: No results for input(s): "CHOL", "HDL", "LDLCALC", "TRIG", "CHOLHDL", "LDLDIRECT" in the last 72 hours. Thyroid Function Tests: No results for input(s): "TSH", "T4TOTAL", "T3FREE", "THYROIDAB" in the last 72 hours.  Invalid input(s): "FREET3" Anemia Panel: No results for input(s): "VITAMINB12", "FOLATE", "FERRITIN", "TIBC", "IRON", "RETICCTPCT" in the last 72 hours.   Radiology: CT Angio Chest PE W and/or Wo Contrast Result Date: 02/17/2024 CLINICAL DATA:  Shortness of breath EXAM: CT ANGIOGRAPHY CHEST WITH CONTRAST TECHNIQUE: Multidetector CT imaging of the chest was performed using the standard protocol during bolus administration of intravenous contrast. Multiplanar CT image reconstructions and MIPs were obtained to evaluate the vascular anatomy. RADIATION DOSE REDUCTION: This exam was performed according to the departmental dose-optimization program which includes automated exposure control,  adjustment of the mA and/or kV according to patient size and/or use of iterative reconstruction technique. CONTRAST:  50mL OMNIPAQUE IOHEXOL 350 MG/ML SOLN COMPARISON:  Same day chest radiograph, CT chest dated 04/15/2021 FINDINGS: Cardiovascular: The study is high quality for the evaluation of pulmonary embolism. There are no filling defects in the central, lobar, segmental or subsegmental pulmonary artery branches to suggest acute pulmonary embolism. Great vessels are normal in course and caliber. Multichamber cardiomegaly. RV: LV < 1. no significant pericardial fluid/thickening. Coronary artery calcifications and aortic atherosclerosis. Reflux of contrast material into the hepatic veins, suggesting a degree of right heart dysfunction. Mediastinum/Nodes: Imaged thyroid gland without nodules meeting criteria for imaging follow-up by size. Normal esophagus. No pathologically enlarged axillary, supraclavicular, mediastinal, or hilar lymph nodes. Lungs/Pleura: The central airways are patent. Mild diffuse peribronchial wall thickening with peribronchovascular consolidations. Subsegmental mucous plugging in the lingula and bilateral lower lobes. Mild diffuse interlobular septal thickening with scattered ground-glass opacities, right-greater-than-left. No pneumothorax. Moderate bilateral pleural effusions. Upper abdomen: Cholecystectomy. Partially imaged left kidney with parapelvic cysts and a presumed mildly complicated cyst arising from the posterior left interpolar kidney (4:141). Musculoskeletal: No acute or abnormal lytic or blastic osseous lesions. Multilevel degenerative changes of the thoracic spine. Review of the MIP images confirms the above findings. IMPRESSION: 1. No evidence of pulmonary embolism. 2. Findings of pulmonary edema with moderate bilateral pleural effusions. 3. Multichamber cardiomegaly with reflux of contrast material into the hepatic veins, suggesting a degree of right heart dysfunction. 4.  Aortic Atherosclerosis (ICD10-I70.0). Coronary artery calcifications. Assessment for potential risk factor modification, dietary therapy or pharmacologic therapy may be warranted, if clinically indicated. Electronically Signed   By: Agustin Cree M.D.   On: 02/17/2024 11:40   US Venous Img Lower Bilateral Result Date: 02/17/2024 CLINICAL DATA:  Shortness of breath. Edema. Positive D-dimer. History of B-cell lymphoma. EXAM: Bilateral lower Extremity Venous Doppler Ultrasound TECHNIQUE: Gray-scale sonography with compression, as well as color and duplex ultrasound, were performed to evaluate the deep venous system(s) from the level of the common femoral vein through the popliteal and proximal  calf veins. COMPARISON:  None available FINDINGS: VENOUS Normal compressibility of the common femoral, superficial femoral, and popliteal veins, as well as the visualized calf veins. Visualized portions of profunda femoral vein and great saphenous vein unremarkable. No filling defects to suggest DVT on grayscale or color Doppler imaging. Doppler waveforms show normal direction of venous flow, normal respiratory plasticity and response to augmentation. OTHER None. Limitations: none IMPRESSION: No  lower extremity DVT. Electronically Signed   By: Acquanetta Belling M.D.   On: 02/17/2024 11:23   DG Chest 2 View Result Date: 02/17/2024 CLINICAL DATA:  Dyspnea EXAM: CHEST - 2 VIEW COMPARISON:  Chest x-ray performed August 11, 2019 FINDINGS: Confluent alveolar and interstitial airspace opacities are present in the right mid and upper lung zone. To a lesser extent, interstitial airspace opacities are present on the left. Heart is enlarged. No significant pleural effusion. No pneumothorax. IMPRESSION: 1. Interstitial and alveolar airspace disease, worse on the right. Given enlarged heart, leading differential consideration is edema. Correlate clinically for signs of infection. Electronically Signed   By: Lowell Guitar M.D.   On: 02/17/2024  08:57   Cardiac Catheterization (02/06/2013):  50% stenosis mid LAD  ECHO pending  TELEMETRY reviewed by me 02/18/2024: Not on tele  EKG reviewed by me: Sinus Rhythm, rate 84 bpm.  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 female  with a past medical history of coronary artery disease, hypertension, hyperlipidemia, statin intolerance, chronic kidney disease, hx of non-Hodgkin's lymphoma  who presented to the ED on 02/17/2024 for shortness of breath. Cardiology was consulted for further evaluation.   # Acute decompensated heart failure, unknown EF # Demand ischemia # Hypertension # Hyperlipidemia, stain intolerance  # CKD, stage III Patient reported to the ED yesterday with shortness of breath that's been ongoing for about 1 week with associated cough and denies chest pain. CXR shows pulmonary edema and bilateral pleural effusion. Patient reports SOB is better s/p 1 dose IV lasix 40 mg in ED. Patient has no known history of heart failure. BNP elevated 1,186. Troponins elevated and trending flat 40 > 158 > 131. EKG in ED showed sinus rhythm, rate 84 bpm with no notable ST changes. Patient has been hypertensive with stable HR.  -Echo pending. -Ordered IV lasix 40 mg daily. Closely monitor renal function and UOP. -Continue ASA 81 mg daily. -Resume home losartan 50 mg daily. Can consider spironolactone if BP remains elevated.  -Mildly elevated and flat troponins most consistent with demand ischemia in the setting of acute heart failure.  This patient's plan of care was discussed and created with Dr. Corky Sing and he is in  agreement.  Signed: Gale Journey, PA-C  02/18/2024, 8:55 AM [facility] Cardiology

## 2024-02-18 NOTE — Progress Notes (Signed)
*  PRELIMINARY RESULTS* Echocardiogram 2D Echocardiogram has been performed.  Rebecca Stokes 02/18/2024, 11:23 AM

## 2024-02-18 NOTE — Progress Notes (Signed)
 Heart Failure Navigator Progress Note  Assessed for Heart & Vascular TOC clinic readiness.  Patient does not meet criteria due to current [facility] patient.   Navigator will sign off at this time.  Roxy Horseman, RN, BSN [facility] Heart Failure Navigator Secure Chat Only

## 2024-02-18 NOTE — Plan of Care (Signed)
  Problem: Health Behavior/Discharge Planning: Goal: Ability to manage health-related needs will improve Outcome: Progressing   Problem: Clinical Measurements: Goal: Cardiovascular complication will be avoided Outcome: Progressing   Problem: Elimination: Goal: Will not experience complications related to bowel motility Outcome: Progressing   Problem: Pain Managment: Goal: General experience of comfort will improve and/or be controlled Outcome: Progressing

## 2024-02-18 NOTE — Care Management Obs Status (Signed)
 MEDICARE OBSERVATION STATUS NOTIFICATION   Patient Details  Name: Rebecca Stokes MRN: [id number] Date of Birth: 1932-10-06   Medicare Observation Status Notification Given:  Orland Dec, CMA 02/18/2024, 11:17 AM

## 2024-02-19 DIAGNOSIS — I509 Heart failure, unspecified: Secondary | ICD-10-CM | POA: Diagnosis not present

## 2024-02-19 DIAGNOSIS — I5021 Acute systolic (congestive) heart failure: Secondary | ICD-10-CM | POA: Diagnosis not present

## 2024-02-19 DIAGNOSIS — N1831 Chronic kidney disease, stage 3a: Secondary | ICD-10-CM

## 2024-02-19 DIAGNOSIS — C8333 Diffuse large B-cell lymphoma, intra-abdominal lymph nodes: Secondary | ICD-10-CM

## 2024-02-19 DIAGNOSIS — R7989 Other specified abnormal findings of blood chemistry: Secondary | ICD-10-CM | POA: Diagnosis not present

## 2024-02-19 LAB — BASIC METABOLIC PANEL WITH GFR
Anion gap: 9 (ref 5–15)
BUN: 27 mg/dL — ABNORMAL HIGH (ref 8–23)
CO2: 29 mmol/L (ref 22–32)
Calcium: 8.6 mg/dL — ABNORMAL LOW (ref 8.9–10.3)
Chloride: 104 mmol/L (ref 98–111)
Creatinine, Ser: 1.46 mg/dL — ABNORMAL HIGH (ref 0.44–1.00)
GFR, Estimated: 34 mL/min — ABNORMAL LOW (ref >60)
Glucose, Bld: 106 mg/dL — ABNORMAL HIGH (ref 70–99)
Potassium: 3.4 mmol/L — ABNORMAL LOW (ref 3.5–5.1)
Sodium: 142 mmol/L (ref 135–145)

## 2024-02-19 LAB — MAGNESIUM: Magnesium: 1.8 mg/dL (ref 1.7–2.4)

## 2024-02-19 MED ORDER — FUROSEMIDE 10 MG/ML IJ SOLN: 20.0000 mg | Freq: Once | INTRAMUSCULAR | Status: DC

## 2024-02-19 MED ORDER — FUROSEMIDE 20 MG PO TABS: 20.0000 mg | ORAL_TABLET | Freq: Every day | ORAL | Status: DC

## 2024-02-19 MED ORDER — FUROSEMIDE 10 MG/ML IJ SOLN
40.0000 mg | Freq: Once | INTRAMUSCULAR | Status: AC
  Administered 2024-02-20: 40 mg via INTRAVENOUS
  Filled 2024-02-19: qty 4

## 2024-02-19 NOTE — Plan of Care (Signed)
  Problem: Clinical Measurements: Goal: Ability to maintain clinical measurements within normal limits will improve Outcome: Progressing Goal: Will remain free from infection Outcome: Progressing Goal: Cardiovascular complication will be avoided Outcome: Progressing   Problem: Activity: Goal: Risk for activity intolerance will decrease Outcome: Progressing

## 2024-02-19 NOTE — Progress Notes (Addendum)
 [facility]  - [place] at [facility]   PATIENT NAME: Rebecca Stokes    MR#:  [id number]  DATE OF BIRTH:  11-20-1931  SUBJECTIVE:  no new issues per RN. Came in with shortness of breath, PND and orthopnea. Receiving IV Lasix. Improving. Denies any chest pain. Daughter-in-law at bedside. Spoke with daughter Cary Clarks on the phone in the room.   VITALS:  Blood pressure (!) 165/68, pulse 63, temperature 97.9 F (36.6 C), temperature source Oral, resp. rate 15, height 5\' 1"  (1.549 m), weight 60.1 kg, SpO2 98%.  PHYSICAL EXAMINATION:   GENERAL:  89 y.o.-year-old patient with no acute distress.  LUNGS: decreased breath sounds bilaterally, no wheezing CARDIOVASCULAR: S1, S2 normal. No murmur   ABDOMEN: Soft, nontender, nondistended. Bowel sounds present.  EXTREMITIES: 1+ edema b/l.    NEUROLOGIC: nonfocal  patient is alert and awake  LABORATORY PANEL:  CBC Recent Labs  Lab 02/18/24 0729  WBC 8.4  HGB 12.7  HCT 37.4  PLT 238    Chemistries  Recent Labs  Lab 02/18/24 0536 02/19/24 0511  NA 142 142  K 3.7 3.4*  CL 107 104  CO2 26 29  GLUCOSE 82 106*  BUN 23 27*  CREATININE 1.20* 1.46*  CALCIUM 8.3* 8.6*  MG  --  1.8  AST 32  --   ALT 13  --   ALKPHOS 50  --   BILITOT 0.9  --     RADIOLOGY:  ECHOCARDIOGRAM COMPLETE Result Date: 02/18/2024    ECHOCARDIOGRAM REPORT   Patient Name:   Rebecca Stokes Date of Exam: 02/18/2024 Medical Rec #:  [id number]       Height:       61.0 in Accession #:    [id number]      Weight:       137.1 lb Date of Birth:  Jan 28, 1932      BSA:          1.609 m Patient Age:    89 years        BP:           155/74 mmHg Patient Gender: F               HR:           71 bpm. Exam Location:  [facility] Procedure: 2D Echo, 3D Echo, Cardiac Doppler, Color Doppler and Strain Analysis            (Both Spectral and Color Flow Doppler were utilized during            procedure). Indications:     CHF  History:         Patient has no prior history of  Echocardiogram examinations.                  CHF, Signs/Symptoms:Shortness of Breath; Risk                  Factors:Hypertension. CKD.  Sonographer:     Clarke Crouch Referring Phys:  [phone] STEVEN J NEWTON Diagnosing Phys: Joetta Mustache  Sonographer Comments: Global longitudinal strain was attempted. IMPRESSIONS  1. Left ventricular ejection fraction, by estimation, is 45%. The left ventricle has mildly decreased function. The left ventricle demonstrates global hypokinesis. There is mild left ventricular hypertrophy. Left ventricular diastolic parameters are consistent with Grade II diastolic dysfunction (pseudonormalization).  2. Right ventricular systolic function is normal. The right ventricular size is normal. There is normal pulmonary artery systolic pressure.  3.  Left atrial size was severely dilated.  4. Mild to moderate mitral valve regurgitation.  5. The aortic valve is tricuspid. Aortic valve regurgitation is mild. Aortic valve sclerosis/calcification is present, without any evidence of aortic stenosis. FINDINGS  Left Ventricle: Left ventricular ejection fraction, by estimation, is 45%. The left ventricle has mildly decreased function. The left ventricle demonstrates global hypokinesis. The left ventricular internal cavity size was normal in size. There is mild left ventricular hypertrophy. Left ventricular diastolic parameters are consistent with Grade II diastolic dysfunction (pseudonormalization). Right Ventricle: The right ventricular size is normal. No increase in right ventricular wall thickness. Right ventricular systolic function is normal. There is normal pulmonary artery systolic pressure. The tricuspid regurgitant velocity is 2.41 m/s, and  with an assumed right atrial pressure of 3 mmHg, the estimated right ventricular systolic pressure is 26.2 mmHg. Left Atrium: Left atrial size was severely dilated. Right Atrium: Right atrial size was normal in size. Pericardium: There is no evidence of  pericardial effusion. Mitral Valve: There is mild thickening of the mitral valve leaflet(s). Mild to moderate mitral valve regurgitation. MV peak gradient, 4.2 mmHg. The mean mitral valve gradient is 2.0 mmHg. Tricuspid Valve: The tricuspid valve is normal in structure. Tricuspid valve regurgitation is trivial. Aortic Valve: The aortic valve is tricuspid. Aortic valve regurgitation is mild. Aortic valve sclerosis/calcification is present, without any evidence of aortic stenosis. Aortic valve mean gradient measures 8.5 mmHg. Aortic valve peak gradient measures 17.9 mmHg. Aortic valve area, by VTI measures 1.80 cm. Pulmonic Valve: The pulmonic valve was not well visualized. Pulmonic valve regurgitation is not visualized. Aorta: The aortic root is normal in size and structure. IAS/Shunts: The atrial septum is grossly normal.  LEFT VENTRICLE PLAX 2D LVIDd:         5.00 cm      Diastology LVIDs:         3.70 cm      LV e' medial:    4.19 cm/s LV PW:         1.50 cm      LV E/e' medial:  23.1 LV IVS:        1.20 cm      LV e' lateral:   5.29 cm/s LVOT diam:     2.00 cm      LV E/e' lateral: 18.3 LV SV:         89 LV SV Index:   55 LVOT Area:     3.14 cm  LV Volumes (MOD) LV vol d, MOD A2C: 105.0 ml LV vol d, MOD A4C: 106.0 ml LV vol s, MOD A2C: 59.0 ml LV vol s, MOD A4C: 58.1 ml LV SV MOD A2C:     46.0 ml LV SV MOD A4C:     106.0 ml LV SV MOD BP:      48.6 ml RIGHT VENTRICLE RV Basal diam:  3.45 cm RV Mid diam:    2.80 cm RV S prime:     30.50 cm/s TAPSE (M-mode): 2.2 cm LEFT ATRIUM             Index        RIGHT ATRIUM           Index LA diam:        4.00 cm 2.49 cm/m   RA Area:     13.80 cm LA Vol (A2C):   85.4 ml 53.08 ml/m  RA Volume:   30.60 ml  19.02 ml/m LA Vol (A4C):   78.1  ml 48.54 ml/m LA Biplane Vol: 90.5 ml 56.25 ml/m  AORTIC VALVE AV Area (Vmax):    1.72 cm AV Area (Vmean):   1.89 cm AV Area (VTI):     1.80 cm AV Vmax:           211.50 cm/s AV Vmean:          131.500 cm/s AV VTI:            0.495 m  AV Peak Grad:      17.9 mmHg AV Mean Grad:      8.5 mmHg LVOT Vmax:         116.00 cm/s LVOT Vmean:        79.100 cm/s LVOT VTI:          0.283 m LVOT/AV VTI ratio: 0.57  AORTA Ao Root diam: 3.20 cm MITRAL VALVE                  TRICUSPID VALVE MV Area (PHT): 3.19 cm       TR Peak grad:   23.2 mmHg MV Area VTI:   2.67 cm       TR Vmax:        241.00 cm/s MV Peak grad:  4.2 mmHg MV Mean grad:  2.0 mmHg       SHUNTS MV Vmax:       1.03 m/s       Systemic VTI:  0.28 m MV Vmean:      58.2 cm/s      Systemic Diam: 2.00 cm MV Decel Time: 238 msec MR Peak grad:    119.7 mmHg MR Mean grad:    77.0 mmHg MR Vmax:         547.00 cm/s MR Vmean:        409.0 cm/s MR PISA:         1.57 cm MR PISA Eff ROA: 27 mm MR PISA Radius:  0.50 cm MV E velocity: 96.90 cm/s MV A velocity: 75.50 cm/s MV E/A ratio:  1.28 Joetta Mustache Electronically signed by Joetta Mustache Signature Date/Time: 02/18/2024/1:01:07 PM    Final     Assessment and Plan  JENISE IANNELLI is a 89 y.o. female with medical history significant of hypertension, prior history of non-Hodgkin's lymphoma, dementia, presenting with worsening shortness of breath over multiple days.  Positive orthopnea, PND, lower extremity swelling.    Acute congestive heart failure systolic new onset  Bilateral pleural effusion --Progressive shortness of breath over multiple days with noted orthopnea, PND and lower extremity swelling.  BNP 1186 --CTA of the chest with pulmonary edema as well as multichamber cardiomegaly-no PE --No known prior history of heart failure in the past --started on IV lasix 40 with good response.  --cont IV lasix 40 mg daily --Echo Left ventricular ejection fraction, by estimation, is 45%. The left  ventricle has mildly decreased function. The left ventricle demonstrates  global hypokinesis. There is mild left ventricular hypertrophy. Left  ventricular diastolic parameters are  consistent with Grade II diastolic dysfunction  (pseudonormalization).   2. Right ventricular systolic function is normal. The right ventricular  size is normal. There is normal pulmonary artery systolic pressure.   3. Left atrial size was severely dilated.   4. Mild to moderate mitral valve regurgitation.  -- Continue losartan. 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 Wilhelmenia Harada October 2022   HTN --BP elevated on presentation --cont losartan --cont IV lasix  Patient ambulated with mobility therapist about 350 feet. Sats remains stable.     DVT prophylaxis: Lovenox SQ Code Status: Full code  Family Communication: daughter-in-law updated at bedside today Consults :[facility] cardiology Level of care: Med-Surg Status is: Inpatient Remains inpatient appropriate because: IV lasix fro new onset CHF for 1-2 days    TOTAL TIME TAKING CARE OF THIS PATIENT: 40 minutes.  >50% time spent on counselling and coordination of care  Note: This dictation was prepared with Dragon dictation along with smaller phrase technology. Any transcriptional errors that result from this process are unintentional.  Melvinia Stager M.D    [facility]   CC: Primary care physician; Delmus Ferri, MD

## 2024-02-19 NOTE — Progress Notes (Signed)
 Mobility Specialist - Progress Note   02/19/24 0950  Mobility  Activity Ambulated with assistance in hallway;Stood at bedside;Dangled on edge of bed  Level of Assistance Standby assist, set-up cues, supervision of patient - no hands on  Assistive Device None  Distance Ambulated (ft) 350 ft  Activity Response Tolerated well  Mobility Referral Yes  Mobility visit 1 Mobility  Mobility Specialist Start Time (ACUTE ONLY) 0936  Mobility Specialist Stop Time (ACUTE ONLY) 0948  Mobility Specialist Time Calculation (min) (ACUTE ONLY) 12 min   Pt semi-supine in bed on RA upon arrival. Pt completes bed mobility, dons shoes, STS, and ambulates in hallway Supervision with no physical assistance needed. Pt does occasionally use rails in hallway but otherwise no AD needed. Pt left in recliner with needs in reach and daughter present.   Wash Hack  Mobility Specialist  02/19/24 9:51 AM

## 2024-02-19 NOTE — Plan of Care (Signed)
 Pt alert to self, denies any c/o pain. Pt has history of Dementia, family at bedside. No dyspnea noted and no pitting edema noted to bilateral lower legs. Pt sitting up in chair doing a puzzle with family at bedside.  Problem: Education: Goal: Knowledge of General Education information will improve Description: Including pain rating scale, medication(s)/side effects and non-pharmacologic comfort measures Outcome: Progressing   Problem: Health Behavior/Discharge Planning: Goal: Ability to manage health-related needs will improve Outcome: Progressing   Problem: Clinical Measurements: Goal: Ability to maintain clinical measurements within normal limits will improve Outcome: Progressing Goal: Will remain free from infection Outcome: Progressing Goal: Diagnostic test results will improve Outcome: Progressing Goal: Respiratory complications will improve Outcome: Progressing Goal: Cardiovascular complication will be avoided Outcome: Progressing   Problem: Activity: Goal: Risk for activity intolerance will decrease Outcome: Progressing   Problem: Nutrition: Goal: Adequate nutrition will be maintained Outcome: Progressing   Problem: Coping: Goal: Level of anxiety will decrease Outcome: Progressing   Problem: Elimination: Goal: Will not experience complications related to bowel motility Outcome: Progressing Goal: Will not experience complications related to urinary retention Outcome: Progressing   Problem: Pain Managment: Goal: General experience of comfort will improve and/or be controlled Outcome: Progressing   Problem: Safety: Goal: Ability to remain free from injury will improve Outcome: Progressing   Problem: Skin Integrity: Goal: Risk for impaired skin integrity will decrease Outcome: Progressing   Problem: Education: Goal: Ability to demonstrate management of disease process will improve Outcome: Progressing Goal: Ability to verbalize understanding of medication  therapies will improve Outcome: Progressing Goal: Individualized Educational Video(s) Outcome: Progressing   Problem: Activity: Goal: Capacity to carry out activities will improve Outcome: Progressing   Problem: Cardiac: Goal: Ability to achieve and maintain adequate cardiopulmonary perfusion will improve Outcome: Progressing

## 2024-02-19 NOTE — Progress Notes (Signed)
 SUBJECTIVE: Feeling better, less SOB   Vitals:   02/18/24 2128 02/19/24 0516 02/19/24 0730 02/19/24 1115  BP: (!) 169/78 (!) 142/91 132/68 (!) 165/68  Pulse: 72 94 86 63  Resp: 18 18 18 15   Temp: 97.8 F (36.6 C) 97.6 F (36.4 C) 98.4 F (36.9 C) 97.9 F (36.6 C)  TempSrc:   Oral Oral  SpO2: 99% 99%  98%  Weight:  60.1 kg    Height:        Intake/Output Summary (Last 24 hours) at 02/19/2024 1340 Last data filed at 02/19/2024 1039 Gross per 24 hour  Intake 360 ml  Output 400 ml  Net -40 ml    LABS: Basic Metabolic Panel: Recent Labs    02/18/24 0536 02/19/24 0511  NA 142 142  K 3.7 3.4*  CL 107 104  CO2 26 29  GLUCOSE 82 106*  BUN 23 27*  CREATININE 1.20* 1.46*  CALCIUM 8.3* 8.6*  MG  --  1.8   Liver Function Tests: Recent Labs    02/17/24 0836 02/18/24 0536  AST 46* 32  ALT 14 13  ALKPHOS 68 50  BILITOT 0.8 0.9  PROT 6.7 5.2*  ALBUMIN 3.2* 2.7*   No results for input(s): "LIPASE", "AMYLASE" in the last 72 hours. CBC: Recent Labs    02/17/24 0836 02/18/24 0729  WBC 7.4 8.4  HGB 11.9* 12.7  HCT 37.1 37.4  MCV 96.6 90.6  PLT 216 238   Cardiac Enzymes: No results for input(s): "CKTOTAL", "CKMB", "CKMBINDEX", "TROPONINI" in the last 72 hours. BNP: Invalid input(s): "POCBNP" D-Dimer: Recent Labs    02/17/24 0827  DDIMER 3.68*   Hemoglobin A1C: No results for input(s): "HGBA1C" in the last 72 hours. Fasting Lipid Panel: No results for input(s): "CHOL", "HDL", "LDLCALC", "TRIG", "CHOLHDL", "LDLDIRECT" in the last 72 hours. Thyroid Function Tests: No results for input(s): "TSH", "T4TOTAL", "T3FREE", "THYROIDAB" in the last 72 hours.  Invalid input(s): "FREET3" Anemia Panel: No results for input(s): "VITAMINB12", "FOLATE", "FERRITIN", "TIBC", "IRON", "RETICCTPCT" in the last 72 hours.   PHYSICAL EXAM General: Well developed, well nourished, in no acute distress HEENT:  Normocephalic and atramatic Neck:  No JVD.  Lungs: Clear bilaterally  to auscultation and percussion. Heart: HRRR . Normal S1 and S2 without gallops or murmurs.  Abdomen: Bowel sounds are positive, abdomen soft and non-tender  Msk:  Back normal, normal gait. Normal strength and tone for age. Extremities: No clubbing, cyanosis or edema.   Neuro: Alert and oriented X 3. Psych:  Good affect, responds appropriately  TELEMETRY: not on tele, but in NSR on EKG  ASSESSMENT AND PLAN:    ICD-10-CM   1. Hypertensive urgency  I16.0     2. Volume overload state of heart  E87.79       Principal Problem:   CHF (congestive heart failure) (HCC) Active Problems:   BP (high blood pressure)   Diffuse large B-cell lymphoma of intra-abdominal lymph nodes (HCC)   Shortness of breath   CKD (chronic kidney disease), stage III (HCC)   Elevated troponin   Heart failure (HCC)   Rebecca Stokes is a 89 y.o. female  with a past medical history of coronary artery disease, hypertension, hyperlipidemia, statin intolerance, chronic kidney disease, hx of non-Hodgkin's lymphoma  who presented to the ED on 02/17/2024 for shortness of breath. Cardiology was consulted for further evaluation.    # Acute decompensated heart failure, unknown EF # Demand ischemia # Hypertension # Hyperlipidemia, stain intolerance  #  CKD, stage III Patient reported to the ED yesterday with shortness of breath that's been ongoing for about 1 week with associated cough and denies chest pain. CXR shows pulmonary edema and bilateral pleural effusion. Patient reports SOB is better s/p 1 dose IV lasix 40 mg in ED. Patient has no known history of heart failure. BNP elevated 1,186. Troponins elevated and trending flat 40 > 158 > 131. EKG in ED showed sinus rhythm, rate 84 bpm with no notable ST changes. Patient has been hypertensive with stable HR.  -ECHO has LVEF 45% mild to mod MR - Continue IV lasix 40 mg daily. Closely monitor renal function and UOP. -Continue ASA 81 mg daily. -Resume home losartan 50 mg daily.  Can consider spironolactone if BP remains elevated.  -Mildly elevated and flat troponins most consistent with demand ischemia in the setting of acute heart failure.   Debborah Fairly, MD, [facility] 02/19/2024 1:40 PM

## 2024-02-20 DIAGNOSIS — I5021 Acute systolic (congestive) heart failure: Secondary | ICD-10-CM | POA: Diagnosis not present

## 2024-02-20 DIAGNOSIS — I10 Essential (primary) hypertension: Secondary | ICD-10-CM | POA: Diagnosis not present

## 2024-02-20 DIAGNOSIS — C8333 Diffuse large B-cell lymphoma, intra-abdominal lymph nodes: Secondary | ICD-10-CM | POA: Diagnosis not present

## 2024-02-20 DIAGNOSIS — N1831 Chronic kidney disease, stage 3a: Secondary | ICD-10-CM | POA: Diagnosis not present

## 2024-02-20 DIAGNOSIS — I509 Heart failure, unspecified: Secondary | ICD-10-CM | POA: Diagnosis not present

## 2024-02-20 LAB — MAGNESIUM: Magnesium: 1.8 mg/dL (ref 1.7–2.4)

## 2024-02-20 MED ORDER — FUROSEMIDE 20 MG PO TABS: 20.0000 mg | ORAL_TABLET | ORAL | 2 refills | Status: AC

## 2024-02-20 MED ORDER — ASPIRIN 81 MG PO TBEC: 81.0000 mg | DELAYED_RELEASE_TABLET | Freq: Every day | ORAL | 12 refills | Status: AC

## 2024-02-20 NOTE — Progress Notes (Signed)
 SUBJECTIVE: Patient is feeling a lot better denies any shortness of breath at this time.   Vitals:   02/19/24 2135 02/20/24 0402 02/20/24 0402 02/20/24 0829  BP: 130/67  (!) 144/113 (!) 128/49  Pulse: 73  77 (!) 59  Resp: 18  20 18   Temp: 97.9 F (36.6 C)  97.7 F (36.5 C) 98.1 F (36.7 C)  TempSrc: Oral     SpO2: 98%  96% 95%  Weight:  59.7 kg    Height:        Intake/Output Summary (Last 24 hours) at 02/20/2024 1142 Last data filed at 02/19/2024 1428 Gross per 24 hour  Intake 120 ml  Output --  Net 120 ml    LABS: Basic Metabolic Panel: Recent Labs    02/18/24 0536 02/19/24 0511 02/20/24 0457  NA 142 142  --   K 3.7 3.4*  --   CL 107 104  --   CO2 26 29  --   GLUCOSE 82 106*  --   BUN 23 27*  --   CREATININE 1.20* 1.46*  --   CALCIUM 8.3* 8.6*  --   MG  --  1.8 1.8   Liver Function Tests: Recent Labs    02/18/24 0536  AST 32  ALT 13  ALKPHOS 50  BILITOT 0.9  PROT 5.2*  ALBUMIN 2.7*   No results for input(s): "LIPASE", "AMYLASE" in the last 72 hours. CBC: Recent Labs    02/18/24 0729  WBC 8.4  HGB 12.7  HCT 37.4  MCV 90.6  PLT 238   Cardiac Enzymes: No results for input(s): "CKTOTAL", "CKMB", "CKMBINDEX", "TROPONINI" in the last 72 hours. BNP: Invalid input(s): "POCBNP" D-Dimer: No results for input(s): "DDIMER" in the last 72 hours. Hemoglobin A1C: No results for input(s): "HGBA1C" in the last 72 hours. Fasting Lipid Panel: No results for input(s): "CHOL", "HDL", "LDLCALC", "TRIG", "CHOLHDL", "LDLDIRECT" in the last 72 hours. Thyroid Function Tests: No results for input(s): "TSH", "T4TOTAL", "T3FREE", "THYROIDAB" in the last 72 hours.  Invalid input(s): "FREET3" Anemia Panel: No results for input(s): "VITAMINB12", "FOLATE", "FERRITIN", "TIBC", "IRON", "RETICCTPCT" in the last 72 hours.   PHYSICAL EXAM General: Well developed, well nourished, in no acute distress HEENT:  Normocephalic and atramatic Neck:  No JVD.  Lungs: Clear  bilaterally to auscultation and percussion. Heart: HRRR . Normal S1 and S2 without gallops or murmurs.  Abdomen: Bowel sounds are positive, abdomen soft and non-tender  Msk:  Back normal, normal gait. Normal strength and tone for age. Extremities: No clubbing, cyanosis or edema.   Neuro: Alert and oriented X 3. Psych:  Good affect, responds appropriately  TELEMETRY: Not on monitor  ASSESSMENT AND PLAN:    ICD-10-CM   1. Hypertensive urgency  I16.0     2. Volume overload state of heart  E87.79       Principal Problem:   CHF (congestive heart failure) (HCC) Active Problems:   BP (high blood pressure)   Diffuse large B-cell lymphoma of intra-abdominal lymph nodes (HCC)   Shortness of breath   CKD (chronic kidney disease), stage III (HCC)   Elevated troponin   Heart failure (HCC)   Rebecca Stokes is a 89 y.o. female  with a past medical history of coronary artery disease, hypertension, hyperlipidemia, statin intolerance, chronic kidney disease, hx of non-Hodgkin's lymphoma  who presented to the ED on 02/17/2024 for shortness of breath. Cardiology was consulted for further evaluation.    # Acute decompensated heart failure, unknown EF #  Demand ischemia # Hypertension # Hyperlipidemia, stain intolerance  # CKD, stage III Patient reported to the ED yesterday with shortness of breath that's been ongoing for about 1 week with associated cough and denies chest pain. CXR shows pulmonary edema and bilateral pleural effusion. Patient reports SOB is better s/p 1 dose IV lasix 40 mg in ED. Patient has no known history of heart failure. BNP elevated 1,186. Troponins elevated and trending flat 40 > 158 > 131. EKG in ED showed sinus rhythm, rate 84 bpm with no notable ST changes. Patient has been hypertensive with stable HR.  -ECHO has LVEF 45% mild to mod MR - Continue IV lasix 40 mg daily. Closely monitor renal function and UOP. -Continue ASA 81 mg daily. -Resume home losartan 50 mg daily. Can  consider spironolactone if BP remains elevated.  -Mildly elevated and flat troponins most consistent with demand ischemia in the setting of acute heart failure.    Debborah Fairly, MD, [facility] 02/20/2024 11:42 AM

## 2024-02-20 NOTE — Discharge Summary (Signed)
 Physician Discharge Summary   Patient: Rebecca Stokes MRN: [id number] DOB: 1932/02/03  Admit date:     02/17/2024  Discharge date: 02/20/24  Discharge Physician: Melvinia Stager   PCP: Rebecca Ferri, MD   Recommendations at discharge:    F/u [facility] cardiology Rebecca Stokes in 1-2 weeks F/u PCP in 1-2 weeks BMP to be checked by PCP or Cardiology on f/u  Discharge Diagnoses: Principal Problem:   CHF (congestive heart failure) (HCC) Active Problems:   Shortness of breath   BP (high blood pressure)   Diffuse large B-cell lymphoma of intra-abdominal lymph nodes (HCC)   CKD (chronic kidney disease), stage III (HCC)   Elevated troponin   Heart failure (HCC)   Rebecca Stokes is a 89 y.o. female with medical history significant of hypertension, prior history of non-Hodgkin's lymphoma, dementia, presenting with worsening shortness of breath over multiple days.  Positive orthopnea, PND, lower extremity swelling.    Acute congestive heart failure systolic new onset  Bilateral pleural effusion --Progressive shortness of breath over multiple days with noted orthopnea, PND and lower extremity swelling.  BNP 1186 --CTA of the chest with pulmonary edema as well as multichamber cardiomegaly-no PE --No known prior history of heart failure in the past --started on IV lasix 40 with good response.  --Echo Left ventricular ejection fraction, by estimation, is 45%. The left  ventricle has mildly decreased function. The left ventricle demonstrates  global hypokinesis. There is mild left ventricular hypertrophy. Left  ventricular diastolic parameters are  consistent with Grade II diastolic dysfunction (pseudonormalization).   2. Right ventricular systolic function is normal. The right ventricular  size is normal. There is normal pulmonary artery systolic pressure.   3. Left atrial size was severely dilated.   4. Mild to moderate mitral valve regurgitation.  -- Continue losartan. Add Spironolactone if  needed else consider as outpatient -- seen by [facility] cardiology --will change to po lasix 20 mg every other day for now. Monitor Creat and dosing as out pt--defer to PCP/Cardiology   Elevated troponin 2/2 demand ischemia   CKD (chronic kidney disease), stage IIIb (HCC) --Appears near baseline --creat 1.46   Diffuse large B-cell lymphoma of intra-abdominal lymph nodes (HCC) --History of lymphoma --Last follow-up with Rebecca. Wilhelmenia Stokes October 2022   HTN --BP elevated on presentation --cont losartan   Patient ambulated with mobility therapist about 350 feet. Sats remains stable.  D/c plans d/w pt's dter Rebecca Stokes at bedside     DVT prophylaxis: Lovenox SQ Code Status: Full code  Family Communication: daughter updated at bedside today Consults :[facility] cardiology Level of care: Med-Surg Status is: Inpatient       Disposition: Home Diet recommendation:  Cardiac diet DISCHARGE MEDICATION: Allergies as of 02/20/2024       Reactions   Fish Allergy Anaphylaxis   Ciprofloxacin Nausea And Vomiting   Codeine Itching, Nausea Only   Ephedrine Itching, Nausea Only   Phenergan [promethazine Hcl] Itching, Nausea Only   Shellfish Allergy    Other reaction(s): UNKNOWN   Simvastatin    Other Reaction(s): Other (See Comments) insomnia   Sulphur [elemental Sulfur] Itching, Nausea Only   Other Rash   Blistering rash from sun exposure   Strawberry Extract Rash        Medication List     TAKE these medications    aspirin EC 81 MG tablet Take 1 tablet (81 mg total) by mouth daily. Swallow whole. Start taking on: February 21, 2024   donepezil 10 MG  tablet Commonly known as: ARICEPT Take 10 mg by mouth daily.   furosemide 20 MG tablet Commonly known as: Lasix Take 1 tablet (20 mg total) by mouth every other day. Start taking on: February 21, 2024   levothyroxine 100 MCG tablet Commonly known as: SYNTHROID Take 100 mcg by mouth daily before breakfast.   losartan 50 MG tablet Commonly known  as: COZAAR Take 50 mg by mouth daily.   memantine 10 MG tablet Commonly known as: NAMENDA Take 1 tablet by mouth 2 (two) times daily.   mirtazapine 30 MG tablet Commonly known as: REMERON Take 30 mg by mouth at bedtime.   WOMENS 50+ MULTI VITAMIN/MIN PO Take by mouth daily.        Follow-up Information     Rebecca Ferri, MD Follow up.   Specialty: Physician Assistant Why: Hospital follow up Contact information: [address] [place] [address] [phone]         Rebecca Medley, MD. Call in 1 week(s).   Specialty: Cardiology Why: Family to call and make appt for CHF f/u Contact information: [address] [place] [address] [phone]                Discharge Exam: Rebecca Stokes Weights   02/18/24 0500 02/19/24 0516 02/20/24 0402  Weight: 62.2 kg 60.1 kg 59.7 kg   Resp CTA CV s1s2 normal Neuro--grossly intact   Condition at discharge: fair  The results of significant diagnostics from this hospitalization (including imaging, microbiology, ancillary and laboratory) are listed below for reference.   Imaging Studies: ECHOCARDIOGRAM COMPLETE Result Date: 02/18/2024    ECHOCARDIOGRAM REPORT   Patient Name:   Rebecca Stokes Date of Exam: 02/18/2024 Medical Rec #:  [id number]       Height:       61.0 in Accession #:    [id number]      Weight:       137.1 lb Date of Birth:  17-Apr-1932      BSA:          1.609 m Patient Age:    89 years        BP:           155/74 mmHg Patient Gender: F               HR:           71 bpm. Exam Location:  [facility] Procedure: 2D Echo, 3D Echo, Cardiac Doppler, Color Doppler and Strain Analysis            (Both Spectral and Color Flow Doppler were utilized during            procedure). Indications:     CHF  History:         Patient has no prior history of Echocardiogram examinations.                  CHF, Signs/Symptoms:Shortness of Breath; Risk                  Factors:Hypertension. CKD.  Sonographer:     Rebecca Stokes Referring Phys:  [phone] Rebecca Stokes Diagnosing Phys: Rebecca Stokes  Sonographer Comments: Global longitudinal strain was attempted. IMPRESSIONS  1. Left ventricular ejection fraction, by estimation, is 45%. The left ventricle has mildly decreased function. The left ventricle demonstrates global hypokinesis. There is mild left ventricular hypertrophy. Left ventricular diastolic parameters are consistent with Grade II diastolic dysfunction (pseudonormalization).  2. Right  ventricular systolic function is normal. The right ventricular size is normal. There is normal pulmonary artery systolic pressure.  3. Left atrial size was severely dilated.  4. Mild to moderate mitral valve regurgitation.  5. The aortic valve is tricuspid. Aortic valve regurgitation is mild. Aortic valve sclerosis/calcification is present, without any evidence of aortic stenosis. FINDINGS  Left Ventricle: Left ventricular ejection fraction, by estimation, is 45%. The left ventricle has mildly decreased function. The left ventricle demonstrates global hypokinesis. The left ventricular internal cavity size was normal in size. There is mild left ventricular hypertrophy. Left ventricular diastolic parameters are consistent with Grade II diastolic dysfunction (pseudonormalization). Right Ventricle: The right ventricular size is normal. No increase in right ventricular wall thickness. Right ventricular systolic function is normal. There is normal pulmonary artery systolic pressure. The tricuspid regurgitant velocity is 2.41 m/s, and  with an assumed right atrial pressure of 3 mmHg, the estimated right ventricular systolic pressure is 26.2 mmHg. Left Atrium: Left atrial size was severely dilated. Right Atrium: Right atrial size was normal in size. Pericardium: There is no evidence of pericardial effusion. Mitral Valve: There is mild thickening of the mitral valve leaflet(s). Mild to moderate mitral valve regurgitation. MV peak gradient, 4.2  mmHg. The mean mitral valve gradient is 2.0 mmHg. Tricuspid Valve: The tricuspid valve is normal in structure. Tricuspid valve regurgitation is trivial. Aortic Valve: The aortic valve is tricuspid. Aortic valve regurgitation is mild. Aortic valve sclerosis/calcification is present, without any evidence of aortic stenosis. Aortic valve mean gradient measures 8.5 mmHg. Aortic valve peak gradient measures 17.9 mmHg. Aortic valve area, by VTI measures 1.80 cm. Pulmonic Valve: The pulmonic valve was not well visualized. Pulmonic valve regurgitation is not visualized. Aorta: The aortic root is normal in size and structure. IAS/Shunts: The atrial septum is grossly normal.  LEFT VENTRICLE PLAX 2D LVIDd:         5.00 cm      Diastology LVIDs:         3.70 cm      LV e' medial:    4.19 cm/s LV PW:         1.50 cm      LV E/e' medial:  23.1 LV IVS:        1.20 cm      LV e' lateral:   5.29 cm/s LVOT diam:     2.00 cm      LV E/e' lateral: 18.3 LV SV:         89 LV SV Index:   55 LVOT Area:     3.14 cm  LV Volumes (MOD) LV vol d, MOD A2C: 105.0 ml LV vol d, MOD A4C: 106.0 ml LV vol s, MOD A2C: 59.0 ml LV vol s, MOD A4C: 58.1 ml LV SV MOD A2C:     46.0 ml LV SV MOD A4C:     106.0 ml LV SV MOD BP:      48.6 ml RIGHT VENTRICLE RV Basal diam:  3.45 cm RV Mid diam:    2.80 cm RV S prime:     30.50 cm/s TAPSE (M-mode): 2.2 cm LEFT ATRIUM             Index        RIGHT ATRIUM           Index LA diam:        4.00 cm 2.49 cm/m   RA Area:     13.80 cm LA Vol (A2C):  85.4 ml 53.08 ml/m  RA Volume:   30.60 ml  19.02 ml/m LA Vol (A4C):   78.1 ml 48.54 ml/m LA Biplane Vol: 90.5 ml 56.25 ml/m  AORTIC VALVE AV Area (Vmax):    1.72 cm AV Area (Vmean):   1.89 cm AV Area (VTI):     1.80 cm AV Vmax:           211.50 cm/s AV Vmean:          131.500 cm/s AV VTI:            0.495 m AV Peak Grad:      17.9 mmHg AV Mean Grad:      8.5 mmHg LVOT Vmax:         116.00 cm/s LVOT Vmean:        79.100 cm/s LVOT VTI:          0.283 m LVOT/AV VTI  ratio: 0.57  AORTA Ao Root diam: 3.20 cm MITRAL VALVE                  TRICUSPID VALVE MV Area (PHT): 3.19 cm       TR Peak grad:   23.2 mmHg MV Area VTI:   2.67 cm       TR Vmax:        241.00 cm/s MV Peak grad:  4.2 mmHg MV Mean grad:  2.0 mmHg       SHUNTS MV Vmax:       1.03 m/s       Systemic VTI:  0.28 m MV Vmean:      58.2 cm/s      Systemic Diam: 2.00 cm MV Decel Time: 238 msec MR Peak grad:    119.7 mmHg MR Mean grad:    77.0 mmHg MR Vmax:         547.00 cm/s MR Vmean:        409.0 cm/s MR PISA:         1.57 cm MR PISA Eff ROA: 27 mm MR PISA Radius:  0.50 cm MV E velocity: 96.90 cm/s MV A velocity: 75.50 cm/s MV E/A ratio:  1.28 Rebecca Stokes Electronically signed by Rebecca Stokes Signature Date/Time: 02/18/2024/1:01:07 PM    Final    CT Angio Chest PE W and/or Wo Contrast Result Date: 02/17/2024 CLINICAL DATA:  Shortness of breath EXAM: CT ANGIOGRAPHY CHEST WITH CONTRAST TECHNIQUE: Multidetector CT imaging of the chest was performed using the standard protocol during bolus administration of intravenous contrast. Multiplanar CT image reconstructions and MIPs were obtained to evaluate the vascular anatomy. RADIATION DOSE REDUCTION: This exam was performed according to the departmental dose-optimization program which includes automated exposure control, adjustment of the mA and/or kV according to patient size and/or use of iterative reconstruction technique. CONTRAST:  50mL OMNIPAQUE IOHEXOL 350 MG/ML SOLN COMPARISON:  Same day chest radiograph, CT chest dated 04/15/2021 FINDINGS: Cardiovascular: The study is high quality for the evaluation of pulmonary embolism. There are no filling defects in the central, lobar, segmental or subsegmental pulmonary artery branches to suggest acute pulmonary embolism. Great vessels are normal in course and caliber. Multichamber cardiomegaly. RV: LV < 1. no significant pericardial fluid/thickening. Coronary artery calcifications and aortic atherosclerosis. Reflux of  contrast material into the hepatic veins, suggesting a degree of right heart dysfunction. Mediastinum/Nodes: Imaged thyroid gland without nodules meeting criteria for imaging follow-up by size. Normal esophagus. No pathologically enlarged axillary, supraclavicular, mediastinal, or hilar lymph nodes. Lungs/Pleura: The central airways are patent.  Mild diffuse peribronchial wall thickening with peribronchovascular consolidations. Subsegmental mucous plugging in the lingula and bilateral lower lobes. Mild diffuse interlobular septal thickening with scattered ground-glass opacities, right-greater-than-left. No pneumothorax. Moderate bilateral pleural effusions. Upper abdomen: Cholecystectomy. Partially imaged left kidney with parapelvic cysts and a presumed mildly complicated cyst arising from the posterior left interpolar kidney (4:141). Musculoskeletal: No acute or abnormal lytic or blastic osseous lesions. Multilevel degenerative changes of the thoracic spine. Review of the MIP images confirms the above findings. IMPRESSION: 1. No evidence of pulmonary embolism. 2. Findings of pulmonary edema with moderate bilateral pleural effusions. 3. Multichamber cardiomegaly with reflux of contrast material into the hepatic veins, suggesting a degree of right heart dysfunction. 4. Aortic Atherosclerosis (ICD10-I70.0). Coronary artery calcifications. Assessment for potential risk factor modification, dietary therapy or pharmacologic therapy may be warranted, if clinically indicated. Electronically Signed   By: Limin  Xu M.D.   On: 02/17/2024 11:40   US  Venous Img Lower Bilateral Result Date: 02/17/2024 CLINICAL DATA:  Shortness of breath. Edema. Positive D-dimer. History of B-cell lymphoma. EXAM: Bilateral lower Extremity Venous Doppler Ultrasound TECHNIQUE: Gray-scale sonography with compression, as well as color and duplex ultrasound, were performed to evaluate the deep venous system(s) from the level of the common femoral  vein through the popliteal and proximal calf veins. COMPARISON:  None available FINDINGS: VENOUS Normal compressibility of the common femoral, superficial femoral, and popliteal veins, as well as the visualized calf veins. Visualized portions of profunda femoral vein and great saphenous vein unremarkable. No filling defects to suggest DVT on grayscale or color Doppler imaging. Doppler waveforms show normal direction of venous flow, normal respiratory plasticity and response to augmentation. OTHER None. Limitations: none IMPRESSION: No  lower extremity DVT. Electronically Signed   By: Elester Grim M.D.   On: 02/17/2024 11:23   DG Chest 2 View Result Date: 02/17/2024 CLINICAL DATA:  Dyspnea EXAM: CHEST - 2 VIEW COMPARISON:  Chest x-ray performed August 11, 2019 FINDINGS: Confluent alveolar and interstitial airspace opacities are present in the right mid and upper lung zone. To a lesser extent, interstitial airspace opacities are present on the left. Heart is enlarged. No significant pleural effusion. No pneumothorax. IMPRESSION: 1. Interstitial and alveolar airspace disease, worse on the right. Given enlarged heart, leading differential consideration is edema. Correlate clinically for signs of infection. Electronically Signed   By: Reagan Camera M.D.   On: 02/17/2024 08:57    Microbiology: No results found for this or any previous visit.  Labs: CBC: Recent Labs  Lab 02/17/24 0836 02/18/24 0729  WBC 7.4 8.4  HGB 11.9* 12.7  HCT 37.1 37.4  MCV 96.6 90.6  PLT 216 238   Basic Metabolic Panel: Recent Labs  Lab 02/17/24 0836 02/18/24 0536 02/19/24 0511 02/20/24 0457  NA 145 142 142  --   K 4.5 3.7 3.4*  --   CL 111 107 104  --   CO2 26 26 29   --   GLUCOSE 115* 82 106*  --   BUN 24* 23 27*  --   CREATININE 1.26* 1.20* 1.46*  --   CALCIUM 8.8* 8.3* 8.6*  --   MG  --   --  1.8 1.8   Liver Function Tests: Recent Labs  Lab 02/17/24 0836 02/18/24 0536  AST 46* 32  ALT 14 13  ALKPHOS 68  50  BILITOT 0.8 0.9  PROT 6.7 5.2*  ALBUMIN 3.2* 2.7*    Discharge time spent: greater than 30 minutes.  Signed: Evadne Ose,  MD [facility] 02/20/2024

## 2024-02-20 NOTE — Plan of Care (Signed)
  Problem: Health Behavior/Discharge Planning: Goal: Ability to manage health-related needs will improve Outcome: Progressing   Problem: Clinical Measurements: Goal: Will remain free from infection Outcome: Progressing   Problem: Nutrition: Goal: Adequate nutrition will be maintained Outcome: Progressing   Problem: Safety: Goal: Ability to remain free from injury will improve Outcome: Progressing

## 2024-08-24 ENCOUNTER — Other Ambulatory Visit: Payer: Self-pay

## 2024-08-24 ENCOUNTER — Encounter: Payer: Self-pay | Admitting: Emergency Medicine

## 2024-08-24 ENCOUNTER — Emergency Department

## 2024-08-24 DIAGNOSIS — Z5321 Procedure and treatment not carried out due to patient leaving prior to being seen by health care provider: Secondary | ICD-10-CM | POA: Insufficient documentation

## 2024-08-24 DIAGNOSIS — W19XXXA Unspecified fall, initial encounter: Secondary | ICD-10-CM | POA: Diagnosis not present

## 2024-08-24 DIAGNOSIS — R443 Hallucinations, unspecified: Secondary | ICD-10-CM | POA: Insufficient documentation

## 2024-08-24 DIAGNOSIS — R531 Weakness: Secondary | ICD-10-CM | POA: Diagnosis not present

## 2024-08-24 LAB — COMPREHENSIVE METABOLIC PANEL WITH GFR
ALT: 14 U/L (ref 0–44)
AST: 34 U/L (ref 15–41)
Albumin: 3.2 g/dL — ABNORMAL LOW (ref 3.5–5.0)
Alkaline Phosphatase: 65 U/L (ref 38–126)
Anion gap: 10 (ref 5–15)
BUN: 41 mg/dL — ABNORMAL HIGH (ref 8–23)
CO2: 28 mmol/L (ref 22–32)
Calcium: 8.8 mg/dL — ABNORMAL LOW (ref 8.9–10.3)
Chloride: 103 mmol/L (ref 98–111)
Creatinine, Ser: 1.64 mg/dL — ABNORMAL HIGH (ref 0.44–1.00)
GFR, Estimated: 30 mL/min — ABNORMAL LOW (ref >60)
Glucose, Bld: 106 mg/dL — ABNORMAL HIGH (ref 70–99)
Potassium: 4.1 mmol/L (ref 3.5–5.1)
Sodium: 141 mmol/L (ref 135–145)
Total Bilirubin: 0.6 mg/dL (ref 0.0–1.2)
Total Protein: 7.4 g/dL (ref 6.5–8.1)

## 2024-08-24 LAB — URINALYSIS, ROUTINE W REFLEX MICROSCOPIC
Bilirubin Urine: NEGATIVE
Glucose, UA: NEGATIVE mg/dL
Hgb urine dipstick: NEGATIVE
Ketones, ur: NEGATIVE mg/dL
Nitrite: NEGATIVE
Protein, ur: >=300 mg/dL — AB
Specific Gravity, Urine: 1.013 (ref 1.005–1.030)
pH: 5 (ref 5.0–8.0)

## 2024-08-24 LAB — CBC
HCT: 35.5 % — ABNORMAL LOW (ref 36.0–46.0)
Hemoglobin: 11.5 g/dL — ABNORMAL LOW (ref 12.0–15.0)
MCH: 31 pg (ref 26.0–34.0)
MCHC: 32.4 g/dL (ref 30.0–36.0)
MCV: 95.7 fL (ref 80.0–100.0)
Platelets: 224 K/uL (ref 150–400)
RBC: 3.71 MIL/uL — ABNORMAL LOW (ref 3.87–5.11)
RDW: 13.4 % (ref 11.5–15.5)
WBC: 7.8 K/uL (ref 4.0–10.5)
nRBC: 0 % (ref 0.0–0.2)

## 2024-08-24 NOTE — ED Triage Notes (Addendum)
 Pt arrives w/ her daughter who she lives w/ and reports pt had fall last night and some hallucinations. Pt fell again tonight and unable to get up due to weakness, denies hallucinations today. Daughter concerned for UTI. Unsure if hit head, denies LOC. Denies pain. Pt reports feeling weak. NADN. No blood thinners

## 2024-08-25 ENCOUNTER — Emergency Department
Admission: EM | Admit: 2024-08-25 | Discharge: 2024-08-25 | Discharge status: Against Medical Advice | Attending: Emergency Medicine | Admitting: Emergency Medicine

## 2024-08-25 ENCOUNTER — Telehealth: Payer: Self-pay | Admitting: Emergency Medicine

## 2024-08-25 NOTE — Telephone Encounter (Signed)
 Called patient due to left emergency department before provider exam to inquire about condition and follow up plans. Spoke to [employer] and she says she did contact on call for pcp and she is going to pick up med now for uti.  She understands that if her mother is getting worse or not doing well at home to bring her back to the ED.

## 2024-11-21 ENCOUNTER — Other Ambulatory Visit: Payer: Self-pay | Admitting: Physician Assistant

## 2024-11-21 DIAGNOSIS — W19XXXS Unspecified fall, sequela: Secondary | ICD-10-CM

## 2024-11-21 DIAGNOSIS — S22080A Wedge compression fracture of T11-T12 vertebra, initial encounter for closed fracture: Secondary | ICD-10-CM

## 2024-11-23 ENCOUNTER — Ambulatory Visit
Admission: RE | Admit: 2024-11-23 | Discharge: 2024-11-23 | Disposition: A | Discharge status: Home / Self Care | Source: Ambulatory Visit | Attending: Physician Assistant | Admitting: Physician Assistant

## 2024-11-23 DIAGNOSIS — M4854XA Collapsed vertebra, not elsewhere classified, thoracic region, initial encounter for fracture: Secondary | ICD-10-CM | POA: Insufficient documentation

## 2024-11-23 DIAGNOSIS — Y92009 Unspecified place in unspecified non-institutional (private) residence as the place of occurrence of the external cause: Secondary | ICD-10-CM | POA: Diagnosis not present

## 2024-11-23 DIAGNOSIS — M5134 Other intervertebral disc degeneration, thoracic region: Secondary | ICD-10-CM | POA: Insufficient documentation

## 2024-11-23 DIAGNOSIS — S22080A Wedge compression fracture of T11-T12 vertebra, initial encounter for closed fracture: Secondary | ICD-10-CM | POA: Diagnosis present

## 2024-11-23 DIAGNOSIS — W19XXXS Unspecified fall, sequela: Secondary | ICD-10-CM | POA: Insufficient documentation
